# Patient Record
Sex: Male | Born: 1950 | Race: Asian | Marital: Married | Smoking: Never smoker
Health system: Southern US, Community
[De-identification: ages and names within clinical notes are randomized; demographics above are authoritative.]

## PROBLEM LIST (undated history)

## (undated) DIAGNOSIS — E119 Type 2 diabetes mellitus without complications: Secondary | ICD-10-CM

## (undated) DIAGNOSIS — E079 Disorder of thyroid, unspecified: Secondary | ICD-10-CM

## (undated) DIAGNOSIS — I10 Essential (primary) hypertension: Secondary | ICD-10-CM

## (undated) HISTORY — DX: Essential (primary) hypertension: I10

## (undated) HISTORY — DX: Disorder of thyroid, unspecified: E07.9

## (undated) HISTORY — PX: CHOLECYSTECTOMY: SHX55

## (undated) HISTORY — DX: Type 2 diabetes mellitus without complications: E11.9

---

## 2011-09-05 ENCOUNTER — Ambulatory Visit
Admission: RE | Admit: 2011-09-05 | Discharge: 2011-09-05 | Disposition: A | Discharge status: Home / Self Care | Payer: No Typology Code available for payment source | Source: Ambulatory Visit | Attending: Geriatric Medicine | Admitting: Geriatric Medicine

## 2011-09-05 ENCOUNTER — Other Ambulatory Visit: Payer: Self-pay | Admitting: Geriatric Medicine

## 2011-09-05 DIAGNOSIS — R29898 Other symptoms and signs involving the musculoskeletal system: Secondary | ICD-10-CM

## 2012-04-14 ENCOUNTER — Encounter (HOSPITAL_COMMUNITY): Payer: Self-pay | Admitting: Emergency Medicine

## 2012-04-14 ENCOUNTER — Emergency Department (HOSPITAL_COMMUNITY)
Admission: EM | Admit: 2012-04-14 | Discharge: 2012-04-14 | Disposition: A | Discharge status: Home / Self Care | Payer: No Typology Code available for payment source | Source: Home / Self Care | Attending: Emergency Medicine | Admitting: Emergency Medicine

## 2012-04-14 DIAGNOSIS — E039 Hypothyroidism, unspecified: Secondary | ICD-10-CM

## 2012-04-14 DIAGNOSIS — M545 Low back pain, unspecified: Secondary | ICD-10-CM

## 2012-04-14 DIAGNOSIS — I10 Essential (primary) hypertension: Secondary | ICD-10-CM

## 2012-04-14 DIAGNOSIS — E119 Type 2 diabetes mellitus without complications: Secondary | ICD-10-CM

## 2012-04-14 DIAGNOSIS — IMO0002 Reserved for concepts with insufficient information to code with codable children: Secondary | ICD-10-CM

## 2012-04-14 DIAGNOSIS — M1710 Unilateral primary osteoarthritis, unspecified knee: Secondary | ICD-10-CM

## 2012-04-14 DIAGNOSIS — M1712 Unilateral primary osteoarthritis, left knee: Secondary | ICD-10-CM

## 2012-04-14 MED ORDER — DICLOFENAC SODIUM 1 % TD GEL: 2.0000 g | Freq: Four times a day (QID) | TRANSDERMAL | Status: DC

## 2012-04-14 MED ORDER — IRBESARTAN 300 MG PO TABS: 300.0000 mg | ORAL_TABLET | Freq: Every day | ORAL | Status: DC

## 2012-04-14 MED ORDER — METFORMIN HCL 500 MG PO TABS: 500.0000 mg | ORAL_TABLET | Freq: Two times a day (BID) | ORAL | Status: DC

## 2012-04-14 MED ORDER — DICLOFENAC POTASSIUM 50 MG PO TABS: 50.0000 mg | ORAL_TABLET | Freq: Three times a day (TID) | ORAL | Status: DC

## 2012-04-14 MED ORDER — GLIMEPIRIDE 2 MG PO TABS: 2.0000 mg | ORAL_TABLET | Freq: Two times a day (BID) | ORAL | Status: DC

## 2012-04-14 MED ORDER — OMEPRAZOLE 20 MG PO CPDR: 20.0000 mg | DELAYED_RELEASE_CAPSULE | Freq: Every day | ORAL | Status: DC

## 2012-04-14 MED ORDER — LEVOTHYROXINE SODIUM 25 MCG PO TABS: 25.0000 ug | ORAL_TABLET | Freq: Every day | ORAL | Status: DC

## 2012-04-14 MED ORDER — METOPROLOL TARTRATE 50 MG PO TABS: 50.0000 mg | ORAL_TABLET | Freq: Two times a day (BID) | ORAL | Status: DC

## 2012-04-14 NOTE — ED Notes (Signed)
Patient complains of left knee pain for the past 6 months. Pain while walking and crossing legs. Pain is worse on medial side of knee.

## 2012-04-14 NOTE — ED Notes (Signed)
Patient left without signing discharge papers.

## 2012-04-14 NOTE — ED Provider Notes (Signed)
Chief Complaint  Patient presents with  . Knee Pain    History of Present Illness:   Jose Brady is a 62 year old male who presents today with multiple medical problems. He moved here about 6 months ago from [place]. He has seen his physician in [place] right before he left and had blood work done at that time. He had just enough of his medication to get through until the present. He does not have a primary care physician in the [place] area. His medical problems include: left knee pain, low back pain, diabetes, hypertension, hypothyroidism, and gastroesophageal reflux. The knee pain has been going on for about 6 months. He denies any injury. Is localized over the medial joint line and he's had some swelling. His doctor in [place] had prescribed diclofenac gel, diclofenac pills, and is also taking fish oil. He's had back pain for about 2-3 years. It radiates down the right leg at times and he's had an x-ray but doesn't think she's ever had an MRI scan. He denies any numbness or weakness in the legs, bladder or bowel symptoms. He's had diabetes for about 8 years and is on metformin 500 mg twice daily and glimepiride 2 mg twice a day. His last hemoglobin A1c was 5.8%. His blood sugars have been under good control with a range of about 90-130 fasting. He denies any hypoglycemic episodes, polyuria, polydipsia, or changes in his vision. He does have some numbness in his hands and tingling and triggering of his fingers in his hands. He has had high blood pressure for about 12 years and is on irbesartan 300 mg once a day and metoprolol 50 mg twice a day. He denies headaches, dizziness, shortness of breath, or chest pain. He has had hypothyroidism for years and is on Synthroid 25 mcg per day with no hyper hypothyroid symptoms. His reflux is controlled with omeprazole. He denies dysphagia, nausea, vomiting, weight loss, or evidence of GI bleeding.  Review of Systems:  Other than noted above, the  patient denies any of the following symptoms. Systemic:  No fever, chills, sweats, fatigue, myalgias, headache, or anorexia. Eye:  No redness, pain or drainage. ENT:  No earache, nasal congestion, rhinorrhea, sinus pressure, or sore throat. Lungs:  No cough, sputum production, wheezing, shortness of breath.  Cardiovascular:  No chest pain, palpitations, or syncope. GI:  No nausea, vomiting, abdominal pain or diarrhea. GU:  No dysuria, frequency, or hematuria. Skin:  No rash or pruritis.  PMFSH:  Past medical history, family history, social history, meds, and allergies were reviewed.   Physical Exam:   Vital signs:  BP 133/97  Pulse 90  Temp 97.2 F (36.2 C) (Oral)  Resp 18  SpO2 99% General:  Alert, in no distress. Eye:  PERRL, full EOMs.  Lids and conjunctivas were normal. ENT:  TMs and canals were normal, without erythema or inflammation.  Nasal mucosa was clear and uncongested, without drainage.  Mucous membranes were moist.  Pharynx was clear, without exudate or drainage.  There were no oral ulcerations or lesions. Neck:  Supple, no adenopathy, tenderness or mass. Thyroid was normal. Lungs:  No respiratory distress.  Lungs were clear to auscultation, without wheezes, rales or rhonchi.  Breath sounds were clear and equal bilaterally. Heart:  Regular rhythm, without gallops, murmers or rubs. Abdomen:  Soft, flat, and non-tender to palpation.  No hepatosplenomagaly or mass. Extremities: There is pain to palpation over the medial joint line of the left knee. His knee  has a full range of motion with slight pain and no crepitus. Back: His back was nontender to palpation and had a full range of motion with minimal pain. Skin:  Clear, warm, and dry, without rash or lesions.  Labs:  No results found for this or any previous visit.   Radiology:  No results found.  Assessment:  The primary encounter diagnosis was Type 2 diabetes mellitus. Diagnoses of Hypertension, Low back pain,  Osteoarthritis of left knee, and Hypothyroidism were also pertinent to this visit.  Plan:   1.  The following meds were prescribed:   New Prescriptions   DICLOFENAC (CATAFLAM) 50 MG TABLET    Take 1 tablet (50 mg total) by mouth 3 (three) times daily.   DICLOFENAC SODIUM (VOLTAREN) 1 % GEL    Apply 2 g topically 4 (four) times daily.   GLIMEPIRIDE (AMARYL) 2 MG TABLET    Take 1 tablet (2 mg total) by mouth 2 (two) times daily.   IRBESARTAN (AVAPRO) 300 MG TABLET    Take 1 tablet (300 mg total) by mouth at bedtime.   LEVOTHYROXINE (LEVOTHROID) 25 MCG TABLET    Take 1 tablet (25 mcg total) by mouth daily.   METFORMIN (GLUCOPHAGE) 500 MG TABLET    Take 1 tablet (500 mg total) by mouth 2 (two) times daily with a meal.   METOPROLOL (LOPRESSOR) 50 MG TABLET    Take 1 tablet (50 mg total) by mouth 2 (two) times daily.   OMEPRAZOLE (PRILOSEC) 20 MG CAPSULE    Take 1 capsule (20 mg total) by mouth daily.   2.  The patient was instructed in symptomatic care and handouts were given. 3.  The patient was told to return if becoming worse in any way, if no better in 3 or 4 days, and given some red flag symptoms that would indicate earlier return.  Follow up:  The patient was told to follow up with the adult clinic as soon as possible, preferably within the next few weeks to establish as a patient there. He has enough refills to last for 6 months. I elected not to get any blood work today since he had had some 6 months ago and he will be following up with the adult clinic in the near future.     Reuben Likes, MD 04/14/12 1147

## 2012-06-17 ENCOUNTER — Encounter (HOSPITAL_COMMUNITY): Payer: Self-pay

## 2012-06-17 ENCOUNTER — Emergency Department (HOSPITAL_COMMUNITY)
Admission: EM | Admit: 2012-06-17 | Discharge: 2012-06-17 | Disposition: A | Discharge status: Home / Self Care | Payer: No Typology Code available for payment source | Source: Home / Self Care | Attending: Family Medicine | Admitting: Family Medicine

## 2012-06-17 DIAGNOSIS — M1710 Unilateral primary osteoarthritis, unspecified knee: Secondary | ICD-10-CM

## 2012-06-17 DIAGNOSIS — I10 Essential (primary) hypertension: Secondary | ICD-10-CM | POA: Diagnosis present

## 2012-06-17 DIAGNOSIS — M545 Low back pain, unspecified: Secondary | ICD-10-CM

## 2012-06-17 DIAGNOSIS — E785 Hyperlipidemia, unspecified: Secondary | ICD-10-CM | POA: Diagnosis present

## 2012-06-17 DIAGNOSIS — E039 Hypothyroidism, unspecified: Secondary | ICD-10-CM | POA: Diagnosis present

## 2012-06-17 DIAGNOSIS — IMO0002 Reserved for concepts with insufficient information to code with codable children: Secondary | ICD-10-CM

## 2012-06-17 DIAGNOSIS — E559 Vitamin D deficiency, unspecified: Secondary | ICD-10-CM | POA: Diagnosis present

## 2012-06-17 DIAGNOSIS — E119 Type 2 diabetes mellitus without complications: Secondary | ICD-10-CM | POA: Diagnosis present

## 2012-06-17 LAB — COMPREHENSIVE METABOLIC PANEL
Alkaline Phosphatase: 99 U/L (ref 39–117)
BUN: 12 mg/dL (ref 6–23)
GFR calc Af Amer: 88 mL/min — ABNORMAL LOW (ref >90)
Glucose, Bld: 122 mg/dL — ABNORMAL HIGH (ref 70–99)
Potassium: 4.2 mEq/L (ref 3.5–5.1)
Total Protein: 7.6 g/dL (ref 6.0–8.3)

## 2012-06-17 LAB — HEMOGLOBIN A1C: Mean Plasma Glucose: 126 mg/dL — ABNORMAL HIGH (ref <117)

## 2012-06-17 LAB — MICROALBUMIN / CREATININE URINE RATIO
Creatinine, Urine: 39.6 mg/dL
Microalb Creat Ratio: 12.6 mg/g (ref 0.0–30.0)
Microalb, Ur: 0.5 mg/dL (ref 0.00–1.89)

## 2012-06-17 LAB — LIPID PANEL
Cholesterol: 194 mg/dL (ref 0–200)
Total CHOL/HDL Ratio: 5.2 RATIO

## 2012-06-17 LAB — CBC
Hemoglobin: 16.2 g/dL (ref 13.0–17.0)
MCH: 30.5 pg (ref 26.0–34.0)
RBC: 5.32 MIL/uL (ref 4.22–5.81)
WBC: 7.2 10*3/uL (ref 4.0–10.5)

## 2012-06-17 LAB — TSH: TSH: 4.474 u[IU]/mL (ref 0.350–4.500)

## 2012-06-17 MED ORDER — PROTONIX 40 MG PO TBEC: 40.0000 mg | DELAYED_RELEASE_TABLET | Freq: Every day | ORAL | Status: DC

## 2012-06-17 MED ORDER — DIOVAN HCT 320-12.5 MG PO TABS: 1.0000 | ORAL_TABLET | Freq: Every day | ORAL | Status: DC

## 2012-06-17 MED ORDER — TOPROL XL 50 MG PO TB24: 50.0000 mg | ORAL_TABLET | Freq: Every day | ORAL | Status: DC

## 2012-06-17 MED ORDER — SYNTHROID 25 MCG PO TABS: 25.0000 ug | ORAL_TABLET | Freq: Every day | ORAL | Status: DC

## 2012-06-17 MED ORDER — METFORMIN HCL ER 500 MG PO TB24: 500.0000 mg | ORAL_TABLET | Freq: Two times a day (BID) | ORAL | Status: DC

## 2012-06-17 NOTE — ED Provider Notes (Signed)
History    CSN: [id number]  Arrival date & time 06/17/12  1034   First MD Initiated Contact with Patient 06/17/12 1056     Chief Complaint  Patient presents with  . Establish Care   HPI Pt is presenting today as a new patient to the clinic with a complex medical history including HTN, Hyperlipidemia and Type 2 diabetes mellitus.  He had been receiving all medical care in [place] but relocated here with his wife.  He reports that he is feeling well except for occasional headaches.  His blood pressure is elevated today.  No chest pain and no history of heart disease reported.  No SOB symptoms.    History reviewed Hypertension Hyperlipidemia Chronic osteoarthritis  History reviewed. No pertinent past surgical history.  No family history on file.  History  Substance Use Topics  . Smoking status: Not on file  . Smokeless tobacco: Not on file  . Alcohol Use: Not on file    Review of Systems  Respiratory: Negative for chest tightness and shortness of breath.   Cardiovascular: Negative for chest pain, palpitations and leg swelling.  Gastrointestinal: Negative for nausea, vomiting, abdominal pain, diarrhea and constipation.  Musculoskeletal: Positive for arthralgias.  Neurological: Positive for headaches. Negative for dizziness, syncope, weakness and numbness.  All other systems reviewed and are negative.    Allergies  Penicillins  Home Medications   Current Outpatient Rx  Name  Route  Sig  Dispense  Refill  . diclofenac (CATAFLAM) 50 MG tablet   Oral   Take 1 tablet (50 mg total) by mouth 3 (three) times daily.   30 tablet   2   . diclofenac sodium (VOLTAREN) 1 % GEL   Topical   Apply 2 g topically 4 (four) times daily.   100 g   5   . glimepiride (AMARYL) 2 MG tablet   Oral   Take 1 tablet (2 mg total) by mouth 2 (two) times daily.   60 tablet   5   . irbesartan (AVAPRO) 300 MG tablet   Oral   Take 1 tablet (300 mg total) by mouth at bedtime.   30 tablet   5   . levothyroxine (LEVOTHROID) 25 MCG tablet   Oral   Take 1 tablet (25 mcg total) by mouth daily.   30 tablet   5   . metFORMIN (GLUCOPHAGE) 500 MG tablet   Oral   Take 1 tablet (500 mg total) by mouth 2 (two) times daily with a meal.   60 tablet   5   . metoprolol (LOPRESSOR) 50 MG tablet   Oral   Take 1 tablet (50 mg total) by mouth 2 (two) times daily.   60 tablet   5   . omeprazole (PRILOSEC) 20 MG capsule   Oral   Take 1 capsule (20 mg total) by mouth daily.   30 capsule   5     BP 149/94  Pulse 81  Temp(Src) 97.8 F (36.6 C) (Oral)  SpO2 100%  Physical Exam  Nursing note and vitals reviewed. Constitutional: He is oriented to person, place, and time. He appears well-developed and well-nourished. No distress.  HENT:  Head: Normocephalic and atraumatic.  Right Ear: External ear normal.  Left Ear: External ear normal.  Nose: Mucosal edema present.  Mouth/Throat: Oropharyngeal exudate present.  Eyes: Conjunctivae and EOM are normal. Pupils are equal, round, and reactive to light.  Neck: Normal range of motion. Neck supple. No JVD present. No  tracheal deviation present. No thyromegaly present.  Cardiovascular: Normal rate, regular rhythm and normal heart sounds.   Pulmonary/Chest: Effort normal and breath sounds normal.  Abdominal: Soft. Bowel sounds are normal. He exhibits no distension and no mass. There is no tenderness. There is no rebound and no guarding.  Musculoskeletal: Normal range of motion. He exhibits no edema and no tenderness.  Lymphadenopathy:    He has no cervical adenopathy.  Neurological: He is alert and oriented to person, place, and time. He has normal reflexes. No cranial nerve deficit. He exhibits normal muscle tone. Coordination normal.  Skin: Skin is warm and dry. No rash noted. No erythema. No pallor.  Psychiatric: He has a normal mood and affect. His behavior is normal. Judgment and thought content normal.    ED Course  Procedures  (including critical care time)  Labs Reviewed - No data to display No results found.  No diagnosis found.  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  The patient verbalized understanding.  The patient was told to call to get lab results if they haven't heard anything in the next week.    Results for orders placed during the hospital encounter of 06/17/12  COMPREHENSIVE METABOLIC PANEL      Result Value Range   Sodium 136  135 - 145 mEq/L   Potassium 4.2  3.5 - 5.1 mEq/L   Chloride 101  96 - 112 mEq/L   CO2 25  19 - 32 mEq/L   Glucose, Bld 122 (*) 70 - 99 mg/dL   BUN 12  6 - 23 mg/dL   Creatinine, Ser 1.02  0.50 - 1.35 mg/dL   Calcium 9.7  8.4 - 72.5 mg/dL   Total Protein 7.6  6.0 - 8.3 g/dL   Albumin 4.1  3.5 - 5.2 g/dL   AST 19  0 - 37 U/L   ALT 21  0 - 53 U/L   Alkaline Phosphatase 99  39 - 117 U/L   Total Bilirubin 0.4  0.3 - 1.2 mg/dL   GFR calc non Af Amer 76 (*) >90 mL/min   GFR calc Af Amer 88 (*) >90 mL/min  LIPID PANEL      Result Value Range   Cholesterol 194  0 - 200 mg/dL   Triglycerides 366 (*) <150 mg/dL   HDL 37 (*) >44 mg/dL   Total CHOL/HDL Ratio 5.2     VLDL 75 (*) 0 - 40 mg/dL   LDL Cholesterol 82  0 - 99 mg/dL  HEMOGLOBIN I3K      Result Value Range   Hemoglobin A1C 6.0 (*) <5.7 %   Mean Plasma  Glucose 126 (*) <117 mg/dL  TSH      Result Value Range   TSH 4.474  0.350 - 4.500 uIU/mL  CBC  Result Value Range   WBC 7.2  4.0 - 10.5 K/uL   RBC 5.32  4.22 - 5.81 MIL/uL   Hemoglobin 16.2  13.0 - 17.0 g/dL   HCT 40.9  81.1 - 91.4 %   MCV 83.6  78.0 - 100.0 fL   MCH 30.5  26.0 - 34.0 pg   MCHC 36.4 (*) 30.0 - 36.0 g/dL   RDW 78.2  95.6 - 21.3 %   Platelets 235  150 - 400 K/uL  MICROALBUMIN / CREATININE URINE RATIO      Result Value Range   Microalb, Ur 0.50  0.00 - 1.89 mg/dL   Creatinine, Urine 08.6     Microalb Creat Ratio 12.6  0.0 - 30.0 mg/g            Cleora Fleet, MD 06/17/12 2045

## 2012-06-17 NOTE — ED Notes (Signed)
Patient her to establish primary care doctor

## 2012-06-18 ENCOUNTER — Telehealth (HOSPITAL_COMMUNITY): Payer: Self-pay

## 2012-06-18 LAB — VITAMIN D 25 HYDROXY (VIT D DEFICIENCY, FRACTURES): Vit D, 25-Hydroxy: 21 ng/mL — ABNORMAL LOW (ref 30–89)

## 2012-06-18 NOTE — Progress Notes (Signed)
Quick Note:  Please inform patient that she has prediabetes. Please mail patient information on diet and diabetes. Pt has Vit D insufficiency. Please have patient start taking vitamin D over the counter 1000 IU caps - 1 po daily. Recheck labs in 3 months.    Rodney Langton, MD, CDE, FAAFP [facility] [place], [place]   ______

## 2012-06-19 MED ORDER — MICARDIS HCT 80-12.5 MG PO TABS: 1.0000 | ORAL_TABLET | Freq: Every day | ORAL | Status: DC

## 2012-06-20 ENCOUNTER — Telehealth (HOSPITAL_COMMUNITY): Payer: Self-pay

## 2012-06-20 NOTE — ED Notes (Signed)
Lab results given  Needs to take vitamin D- OTC 1000IU caps 1 po daily

## 2012-08-12 ENCOUNTER — Telehealth: Payer: Self-pay | Admitting: General Practice

## 2012-08-12 NOTE — Telephone Encounter (Signed)
Medical Assistance Program calling to ask if script for Micardis sent on 08/08/12 can be replaced with Diavan Hct 160/12.5 Month samples.  Fax #[phone].

## 2012-08-13 MED ORDER — VALSARTAN-HYDROCHLOROTHIAZIDE 160-12.5 MG PO TABS: 1.0000 | ORAL_TABLET | Freq: Every day | ORAL | Status: DC

## 2012-08-13 NOTE — Telephone Encounter (Signed)
Health department requesting a change in medications

## 2012-08-13 NOTE — Addendum Note (Signed)
Addended by: Alison Murray on: 08/13/2012 01:41 PM   Modules accepted: Orders, Medications

## 2012-08-26 ENCOUNTER — Telehealth: Payer: Self-pay | Admitting: Family Medicine

## 2012-08-26 NOTE — Telephone Encounter (Signed)
In March, pt was told to f/u in 1 month. i suggest he schedule an appt as he is way overdue for that f/u and the medication issue can be addressed at that time. Thanks AW.

## 2012-08-26 NOTE — Telephone Encounter (Signed)
Pt would like new script for a higher dosage for metoprolol so that instead of twice a day he can take once a day.  He would [facility] to [facility].

## 2012-08-26 NOTE — Telephone Encounter (Signed)
Patient to comeback get BP rechecked and meds adjusted

## 2012-08-27 ENCOUNTER — Telehealth: Payer: Self-pay | Admitting: *Deleted

## 2012-08-27 NOTE — Telephone Encounter (Signed)
08/27/12 Patient was unavailable spoke with patient son Angelica Pou made aware that patient  Need to make  appointment  For blood presssure check and medication adjustment. Appoinment scheduled  for 09/02/12 [facility]

## 2012-09-02 ENCOUNTER — Encounter: Payer: Self-pay | Admitting: Family Medicine

## 2012-09-02 ENCOUNTER — Ambulatory Visit: Payer: No Typology Code available for payment source | Attending: Family Medicine | Admitting: Family Medicine

## 2012-09-02 VITALS — BP 130/84 | HR 84 | Temp 97.9°F | Wt 155.0 lb

## 2012-09-02 DIAGNOSIS — E785 Hyperlipidemia, unspecified: Secondary | ICD-10-CM

## 2012-09-02 DIAGNOSIS — E039 Hypothyroidism, unspecified: Secondary | ICD-10-CM

## 2012-09-02 DIAGNOSIS — E559 Vitamin D deficiency, unspecified: Secondary | ICD-10-CM

## 2012-09-02 DIAGNOSIS — I10 Essential (primary) hypertension: Secondary | ICD-10-CM

## 2012-09-02 DIAGNOSIS — E119 Type 2 diabetes mellitus without complications: Secondary | ICD-10-CM

## 2012-09-02 MED ORDER — VALSARTAN-HYDROCHLOROTHIAZIDE 160-12.5 MG PO TABS: 1.0000 | ORAL_TABLET | Freq: Every day | ORAL | Status: DC

## 2012-09-02 MED ORDER — TOPROL XL 50 MG PO TB24: 50.0000 mg | ORAL_TABLET | Freq: Every day | ORAL | Status: DC

## 2012-09-02 MED ORDER — SYNTHROID 25 MCG PO TABS: 25.0000 ug | ORAL_TABLET | Freq: Every day | ORAL | Status: DC

## 2012-09-02 NOTE — Progress Notes (Signed)
Patient ID: Jose Brady, male   DOB: [birth year], 62 y.o.   MRN: [id number]  CC: follow up   HPI: Pt says that he needs some substitutions for his medicaitons for the MAP program to get the medication from the pharm companies.  He says he is doing well.  He just needs to have his prescriptions switched so that the cost is not so much for the family.    Allergies  Allergen Reactions  . Penicillins Hives   Past Medical History  Diagnosis Date  . Hypertension    Current Outpatient Prescriptions on File Prior to Visit  Medication Sig Dispense Refill  . diclofenac (CATAFLAM) 50 MG tablet Take 1 tablet (50 mg total) by mouth 3 (three) times daily.  30 tablet  2  . glimepiride (AMARYL) 2 MG tablet Take 1 tablet (2 mg total) by mouth 2 (two) times daily.  60 tablet  5  . metFORMIN (GLUCOPHAGE XR) 500 MG 24 hr tablet Take 1 tablet (500 mg total) by mouth 2 (two) times daily with a meal.  60 tablet  3  . PROTONIX 40 MG tablet Take 1 tablet (40 mg total) by mouth daily.  30 tablet  3  . [DISCONTINUED] irbesartan (AVAPRO) 300 MG tablet Take 1 tablet (300 mg total) by mouth at bedtime.  30 tablet  5  . [DISCONTINUED] metoprolol (LOPRESSOR) 50 MG tablet Take 1 tablet (50 mg total) by mouth 2 (two) times daily.  60 tablet  5  . [DISCONTINUED] omeprazole (PRILOSEC) 20 MG capsule Take 1 capsule (20 mg total) by mouth daily.  30 capsule  5   No current facility-administered medications on file prior to visit.   Family History  Problem Relation Age of Onset  . Hypertension Mother   . Hypertension Father    History   Social History  . Marital Status: Married    Spouse Name: N/A    Number of Children: N/A  . Years of Education: N/A   Occupational History  . Not on file.   Social History Main Topics  . Smoking status: Never Smoker   . Smokeless tobacco: Not on file  . Alcohol Use: Not on file  . Drug Use: Not on file  . Sexually Active: Not on file   Other Topics Concern  . Not on  file   Social History Narrative  . No narrative on file    Review of Systems  Constitutional: Negative for fever, chills, diaphoresis, activity change, appetite change and fatigue.  HENT: Negative for ear pain, nosebleeds, congestion, facial swelling, rhinorrhea, neck pain, neck stiffness and ear discharge.   Eyes: Negative for pain, discharge, redness, itching and visual disturbance.  Respiratory: Negative for cough, choking, chest tightness, shortness of breath, wheezing and stridor.   Cardiovascular: Negative for chest pain, palpitations and leg swelling.  Gastrointestinal: Negative for abdominal distention.  Genitourinary: Negative for dysuria, urgency, frequency, hematuria, flank pain, decreased urine volume, difficulty urinating and dyspareunia.  Musculoskeletal: Negative for back pain, joint swelling, arthralgias and gait problem.  Neurological: Negative for dizziness, tremors, seizures, syncope, facial asymmetry, speech difficulty, weakness, light-headedness, numbness and headaches.  Hematological: Negative for adenopathy. Does not bruise/bleed easily.  Psychiatric/Behavioral: Negative for hallucinations, behavioral problems, confusion, dysphoric mood, decreased concentration and agitation.    Objective:   Filed Vitals:   09/02/12 1252  BP: 130/84  Pulse: 84  Temp: 97.9 F (36.6 C)    Physical Exam  Constitutional: Appears well-developed and well-nourished. No distress.  HENT: Normocephalic.  External right and left ear normal. Oropharynx is clear and moist.  Eyes: Conjunctivae and EOM are normal. PERRLA, no scleral icterus.  Neck: Normal ROM. Neck supple. No JVD. No tracheal deviation. No thyromegaly.  CVS: RRR, S1/S2 +, no murmurs, no gallops, no carotid bruit.  Pulmonary: Effort and breath sounds normal, no stridor, rhonchi, wheezes, rales.  Abdominal: Soft. BS +,  no distension, tenderness, rebound or guarding.  Musculoskeletal: Normal range of motion. No edema and no  tenderness.  Lymphadenopathy: No lymphadenopathy noted, cervical, inguinal. Neuro: Alert. Normal reflexes, muscle tone coordination. No cranial nerve deficit. Skin: Skin is warm and dry. No rash noted. Not diaphoretic. No erythema. No pallor.  Psychiatric: Normal mood and affect. Behavior, judgment, thought content normal.   Lab Results  Component Value Date   WBC 7.2 06/17/2012   HGB 16.2 06/17/2012   HCT 44.5 06/17/2012   MCV 83.6 06/17/2012   PLT 235 06/17/2012   Lab Results  Component Value Date   CREATININE 1.02 06/17/2012   BUN 12 06/17/2012   NA 136 06/17/2012   K 4.2 06/17/2012   CL 101 06/17/2012   CO2 25 06/17/2012    Lab Results  Component Value Date   HGBA1C 6.0* 06/17/2012   Lipid Panel     Component Value Date/Time   CHOL 194 06/17/2012 1130   TRIG 376* 06/17/2012 1130   HDL 37* 06/17/2012 1130   CHOLHDL 5.2 06/17/2012 1130   VLDL 75* 06/17/2012 1130   LDLCALC 82 06/17/2012 1130       Assessment and plan:   Patient Active Problem List   Diagnosis Date Noted  . Hypertension 06/17/2012  . Type 2 diabetes mellitus 06/17/2012  . Dyslipidemia 06/17/2012  . Hypothyroidism 06/17/2012  . Vitamin D insufficiency 06/17/2012   Re printed the prescriptions for trhe paitent as it had already been done but I gave them to him to hand deliver to the [facility] program.  He said he would take them over there immediately for diovanhct, synthroid and toprol XL.    Follow up in 3 months  The patient was given clear instructions to go to ER or return to medical center if symptoms don't improve, worsen or new problems develop.  The patient verbalized understanding.  The patient was told to call to get lab results if they haven't heard anything in the next week.    Rodney Langton, MD, CDE, FAAFP [facility] [place], [place]

## 2012-09-02 NOTE — Patient Instructions (Signed)
DASH Diet The DASH diet stands for "Dietary Approaches to Stop Hypertension." It is a healthy eating plan that has been shown to reduce high blood pressure (hypertension) in as little as 14 days, while also possibly providing other significant health benefits. These other health benefits include reducing the risk of breast cancer after menopause and reducing the risk of type 2 diabetes, heart disease, colon cancer, and stroke. Health benefits also include weight loss and slowing kidney failure in patients with chronic kidney disease.  DIET GUIDELINES  Limit salt (sodium). Your diet should contain less than 1500 mg of sodium daily.  Limit refined or processed carbohydrates. Your diet should include mostly whole grains. Desserts and added sugars should be used sparingly.  Include small amounts of heart-healthy fats. These types of fats include nuts, oils, and tub margarine. Limit saturated and trans fats. These fats have been shown to be harmful in the body. CHOOSING FOODS  The following food groups are based on a 2000 calorie diet. See your Registered Dietitian for individual calorie needs. Grains and Grain Products (6 to 8 servings daily)  Eat More Often: Whole-wheat bread, brown rice, whole-grain or wheat pasta, quinoa, popcorn without added fat or salt (air popped).  Eat Less Often: White bread, white pasta, white rice, cornbread. Vegetables (4 to 5 servings daily)  Eat More Often: Fresh, frozen, and canned vegetables. Vegetables may be raw, steamed, roasted, or grilled with a minimal amount of fat.  Eat Less Often/Avoid: Creamed or fried vegetables. Vegetables in a cheese sauce. Fruit (4 to 5 servings daily)  Eat More Often: All fresh, canned (in natural juice), or frozen fruits. Dried fruits without added sugar. One hundred percent fruit juice ( cup [237 mL] daily).  Eat Less Often: Dried fruits with added sugar. Canned fruit in light or heavy syrup. [employer], Fish, and Poultry (2  servings or less daily. One serving is 3 to 4 oz [85-114 g]).  Eat More Often: Ninety percent or leaner ground beef, tenderloin, sirloin. Round cuts of beef, chicken breast, [place] breast. All fish. Grill, bake, or broil your meat. Nothing should be fried.  Eat Less Often/Avoid: Fatty cuts of meat, [place], or chicken leg, thigh, or wing. Fried cuts of meat or fish. Dairy (2 to 3 servings)  Eat More Often: Low-fat or fat-free milk, low-fat plain or light yogurt, reduced-fat or part-skim cheese.  Eat Less Often/Avoid: Milk (whole, 2%).Whole milk yogurt. Full-fat cheeses. Nuts, Seeds, and Legumes (4 to 5 servings per week)  Eat More Often: All without added salt.  Eat Less Often/Avoid: Salted nuts and seeds, canned beans with added salt. Fats and Sweets (limited)  Eat More Often: Vegetable oils, tub margarines without trans fats, sugar-free gelatin. Mayonnaise and salad dressings.  Eat Less Often/Avoid: Coconut oils, palm oils, butter, stick margarine, cream, half and half, cookies, candy, pie. FOR MORE INFORMATION The Dash Diet Eating Plan: www.dashdiet.org Document Released: 03/01/2011 Document Revised: 06/04/2011 Document Reviewed: 03/01/2011 [facility] Patient Information 2014 [place], [place]. Blood Sugar Monitoring, Adult GLUCOSE METERS FOR SELF-MONITORING OF BLOOD GLUCOSE  It is important to be able to correctly measure your blood sugar (glucose). You can use a blood glucose monitor (a small battery-operated device) to check your glucose level at any time. This allows you and your caregiver to monitor your diabetes and to determine how well your treatment plan is working. The process of monitoring your blood glucose with a glucose meter is called self-monitoring of blood glucose (SMBG). When people with diabetes control their  blood sugar, they have better health. To test for glucose with a typical glucose meter, place the disposable strip in the meter. Then place a small sample of blood  on the "test strip." The test strip is coated with chemicals that combine with glucose in blood. The meter measures how much glucose is present. The meter displays the glucose level as a number. Several new models can record and store a number of test results. Some models can connect to personal computers to store test results or print them out.  Newer meters are often easier to use than older models. Some meters allow you to get blood from places other than your fingertip. Some new models have automatic timing, error codes, signals, or barcode readers to help with proper adjustment (calibration). Some meters have a large display screen or spoken instructions for people with visual impairments.  INSTRUCTIONS FOR USING GLUCOSE METERS  Wash your hands with soap and warm water, or clean the area with alcohol. Dry your hands completely.  Prick the side of your fingertip with a lancet (a sharp-pointed tool used by hand).  Hold the hand down and gently milk the finger until a small drop of blood appears. Catch the blood with the test strip.  Follow the instructions for inserting the test strip and using the SMBG meter. Most meters require the meter to be turned on and the test strip to be inserted before applying the blood sample.  Record the test result.  Read the instructions carefully for both the meter and the test strips that go with it. Meter instructions are found in the user manual. Keep this manual to help you solve any problems that may arise. Many meters use "error codes" when there is a problem with the meter, the test strip, or the blood sample on the strip. You will need the manual to understand these error codes and fix the problem.  New devices are available such as laser lancets and meters that can test blood taken from "alternative sites" of the body, other than fingertips. However, you should use standard fingertip testing if your glucose changes rapidly. Also, use standard testing  if:  You have eaten, exercised, or taken insulin in the past 2 hours.  You think your glucose is low.  You tend to not feel symptoms of low blood glucose (hypoglycemia).  You are ill or under stress.  Clean the meter as directed by the manufacturer.  Test the meter for accuracy as directed by the manufacturer.  Take your meter with you to your caregiver's office. This way, you can test your glucose in front of your caregiver to make sure you are using the meter correctly. Your caregiver can also take a sample of blood to test using a routine lab method. If values on the glucose meter are close to the lab results, you and your caregiver will see that your meter is working well and you are using good technique. Your caregiver will advise you about what to do if the results do not match. FREQUENCY OF TESTING  Your caregiver will tell you how often you should check your blood glucose. This will depend on your type of diabetes, your current level of diabetes control, and your types of medicines. The following are general guidelines, but your care plan may be different. Record all your readings and the time of day you took them for review with your caregiver.   Diabetes type 1.  When you are using insulin with good diabetic  control (either multiple daily injections or via a pump), you should check your glucose 4 times a day.  If your diabetes is not well controlled, you may need to monitor more frequently, including before meals and 2 hours after meals, at bedtime, and occasionally between 2 a.m. and 3 a.m.  You should always check your glucose before a dose of insulin or before changing the rate on your insulin pump.  Diabetes type 2.  Guidelines for SMBG in diabetes type 2 are not as well defined.  If you are on insulin, follow the guidelines above.  If you are on medicines, but not insulin, and your glucose is not well controlled, you should test at least twice daily.  If you are not  on insulin, and your diabetes is controlled with medicines or diet alone, you should test at least once daily, usually before breakfast.  A weekly profile will help your caregiver advise you on your care plan. The week before your visit, check your glucose before a meal and 2 hours after a meal at least daily. You may want to test before and after a different meal each day so you and your caregiver can tell how well controlled your blood sugars are throughout the course of a 24 hour period.  Gestational diabetes (diabetes during pregnancy).  Frequent testing is often necessary. Accurate timing is important.  If you are not on insulin, check your glucose 4 times a day. Check it before breakfast and 1 hour after the start of each meal.  If you are on insulin, check your glucose 6 times a day. Check it before each meal and 1 hour after the first bite of each meal.  General guidelines.  More frequent testing is required at the start of insulin treatment. Your caregiver will instruct you.  Test your glucose any time you suspect you have low blood sugar (hypoglycemia).  You should test more often when you change medicines, when you have unusual stress or illness, or in other unusual circumstances. OTHER THINGS TO KNOW ABOUT GLUCOSE METERS  Measurement Range. Most glucose meters are able to read glucose levels over a broad range of values from as low as 0 to as high as 600 mg/dL. If you get an extremely high or low reading from your meter, you should first confirm it with another reading. Report very high or very low readings to your caregiver.  Whole Blood Glucose versus Plasma Glucose. Some older home glucose meters measure glucose in your whole blood. In a lab or when using some newer home glucose meters, the glucose is measured in your plasma (one component of blood). The difference can be important. It is important for you and your caregiver to know whether your meter gives its results as "whole  blood equivalent" or "plasma equivalent."  Display of High and Low Glucose Values. Part of learning how to operate a meter is understanding what the meter results mean. Know how high and low glucose concentrations are displayed on your meter.  Factors that Affect Glucose Meter Performance. The accuracy of your test results depends on many factors and varies depending on the brand and type of meter. These factors include:  Low red blood cell count (anemia).  Substances in your blood (such as uric acid, vitamin C, and others).  Environmental factors (temperature, humidity, altitude).  Name-brand versus generic test strips.  Calibration. Make sure your meter is set up properly. It is a good idea to do a calibration test with a  control solution recommended by the manufacturer of your meter whenever you begin using a fresh bottle of test strips. This will help verify the accuracy of your meter.  Improperly stored, expired, or defective test strips. Keep your strips in a dry place with the lid on.  Soiled meter.  Inadequate blood sample. NEW TECHNOLOGIES FOR GLUCOSE TESTING Alternative site testing Some glucose meters allow testing blood from alternative sites. These include the:  Upper arm.  Forearm.  Base of the thumb.  Thigh. Sampling blood from alternative sites may be desirable. However, it may have some limitations. Blood in the fingertips show changes in glucose levels more quickly than blood in other parts of the body. This means that alternative site test results may be different from fingertip test results, not because of the meter's ability to test accurately, but because the actual glucose concentration can be different.  Continuous Glucose Monitoring Devices to measure your blood glucose continuously are available, and others are in development. These methods can be more expensive than self-monitoring with a glucose meter. However, it is uncertain how effective and reliable  these devices are. Your caregiver will advise you if this approach makes sense for you. IF BLOOD SUGARS ARE CONTROLLED, PEOPLE WITH DIABETES REMAIN HEALTHIER.  SMBG is an important part of the treatment plan of patients with diabetes mellitus. Below are reasons for using SMBG:   It confirms that your glucose is at a specific, healthy level.  It detects hypoglycemia and severe hyperglycemia.  It allows you and your caregiver to make adjustments in response to changes in lifestyle for individuals requiring medicine.  It determines the need for starting insulin therapy in temporary diabetes that happens during pregnancy (gestational diabetes). Document Released: 03/15/2003 Document Revised: 06/04/2011 Document Reviewed: 07/06/2010 [facility] Patient Information 2014 [place], [place]. Hypertension As your heart beats, it forces blood through your arteries. This force is your blood pressure. If the pressure is too high, it is called hypertension (HTN) or high blood pressure. HTN is dangerous because you may have it and not know it. High blood pressure may mean that your heart has to work harder to pump blood. Your arteries may be narrow or stiff. The extra work puts you at risk for heart disease, stroke, and other problems.  Blood pressure consists of two numbers, a higher number over a lower, 110/72, for example. It is stated as "110 over 72." The ideal is below 120 for the top number (systolic) and under 80 for the bottom (diastolic). Write down your blood pressure today. You should pay close attention to your blood pressure if you have certain conditions such as:  Heart failure.  Prior heart attack.  Diabetes  Chronic kidney disease.  Prior stroke.  Multiple risk factors for heart disease. To see if you have HTN, your blood pressure should be measured while you are seated with your arm held at the level of the heart. It should be measured at least twice. A one-time elevated blood pressure  reading (especially in the Emergency Department) does not mean that you need treatment. There may be conditions in which the blood pressure is different between your right and left arms. It is important to see your caregiver soon for a recheck. Most people have essential hypertension which means that there is not a specific cause. This type of high blood pressure may be lowered by changing lifestyle factors such as:  Stress.  Smoking.  Lack of exercise.  Excessive weight.  Drug/tobacco/alcohol use.  Eating less  salt. Most people do not have symptoms from high blood pressure until it has caused damage to the body. Effective treatment can often prevent, delay or reduce that damage. TREATMENT  When a cause has been identified, treatment for high blood pressure is directed at the cause. There are a large number of medications to treat HTN. These fall into several categories, and your caregiver will help you select the medicines that are best for you. Medications may have side effects. You should review side effects with your caregiver. If your blood pressure stays high after you have made lifestyle changes or started on medicines,   Your medication(s) may need to be changed.  Other problems may need to be addressed.  Be certain you understand your prescriptions, and know how and when to take your medicine.  Be sure to follow up with your caregiver within the time frame advised (usually within two weeks) to have your blood pressure rechecked and to review your medications.  If you are taking more than one medicine to lower your blood pressure, make sure you know how and at what times they should be taken. Taking two medicines at the same time can result in blood pressure that is too low. SEEK IMMEDIATE MEDICAL CARE IF:  You develop a severe headache, blurred or changing vision, or confusion.  You have unusual weakness or numbness, or a faint feeling.  You have severe chest or abdominal  pain, vomiting, or breathing problems. MAKE SURE YOU:   Understand these instructions.  Will watch your condition.  Will get help right away if you are not doing well or get worse. Document Released: 03/12/2005 Document Revised: 06/04/2011 Document Reviewed: 10/31/2007 [facility] Patient Information 2014 [place], [place].

## 2012-09-03 ENCOUNTER — Telehealth: Payer: Self-pay | Admitting: Family Medicine

## 2012-09-03 DIAGNOSIS — I10 Essential (primary) hypertension: Secondary | ICD-10-CM

## 2012-09-03 MED ORDER — OLMESARTAN MEDOXOMIL-HCTZ 20-12.5 MG PO TABS: 1.0000 | ORAL_TABLET | Freq: Every day | ORAL | Status: DC

## 2012-09-03 NOTE — Telephone Encounter (Signed)
09/03/12 Patient made aware of need to pick up prescription. Son will come and pick up [facility]

## 2012-09-03 NOTE — Telephone Encounter (Signed)
Note from health dept that diovan hct no longer avail but benicar hct is avail for free. Have printed script. Pt can bring to HD. Pt should let us know if any side effects with new medicine. Schedule BP check in 2 weeks please.

## 2012-10-15 ENCOUNTER — Other Ambulatory Visit: Payer: Self-pay | Admitting: Family Medicine

## 2012-10-15 DIAGNOSIS — I10 Essential (primary) hypertension: Secondary | ICD-10-CM

## 2012-10-15 MED ORDER — OLMESARTAN MEDOXOMIL-HCTZ 40-12.5 MG PO TABS: 1.0000 | ORAL_TABLET | Freq: Every day | ORAL | Status: DC

## 2012-10-15 NOTE — Telephone Encounter (Signed)
[facility] health Department contacted [place] multiple times and that they are not able to provide Diovan or myocarditis and requesting that we switch the medication to Benicar HCT so patient can receive for free.  New prescriptions being faxed over today for Benicar HCT.   Rodney Langton, MD, CDE, FAAFP [facility] [place], [place]

## 2012-11-26 ENCOUNTER — Ambulatory Visit: Payer: No Typology Code available for payment source | Attending: Internal Medicine | Admitting: Internal Medicine

## 2012-11-26 VITALS — BP 142/93 | HR 89 | Temp 97.7°F | Resp 17

## 2012-11-26 DIAGNOSIS — M1712 Unilateral primary osteoarthritis, left knee: Secondary | ICD-10-CM

## 2012-11-26 DIAGNOSIS — E039 Hypothyroidism, unspecified: Secondary | ICD-10-CM

## 2012-11-26 DIAGNOSIS — Z23 Encounter for immunization: Secondary | ICD-10-CM

## 2012-11-26 DIAGNOSIS — M1710 Unilateral primary osteoarthritis, unspecified knee: Secondary | ICD-10-CM

## 2012-11-26 DIAGNOSIS — E119 Type 2 diabetes mellitus without complications: Secondary | ICD-10-CM

## 2012-11-26 DIAGNOSIS — I10 Essential (primary) hypertension: Secondary | ICD-10-CM

## 2012-11-26 DIAGNOSIS — IMO0002 Reserved for concepts with insufficient information to code with codable children: Secondary | ICD-10-CM

## 2012-11-26 MED ORDER — METFORMIN HCL ER 500 MG PO TB24: 500.0000 mg | ORAL_TABLET | Freq: Two times a day (BID) | ORAL | Status: DC

## 2012-11-26 MED ORDER — TOPROL XL 50 MG PO TB24: 50.0000 mg | ORAL_TABLET | Freq: Every day | ORAL | Status: DC

## 2012-11-26 MED ORDER — OLMESARTAN MEDOXOMIL-HCTZ 40-12.5 MG PO TABS: 1.0000 | ORAL_TABLET | Freq: Every day | ORAL | Status: DC

## 2012-11-26 MED ORDER — SYNTHROID 25 MCG PO TABS: 25.0000 ug | ORAL_TABLET | Freq: Every day | ORAL | Status: DC

## 2012-11-26 MED ORDER — PROTONIX 40 MG PO TBEC: 40.0000 mg | DELAYED_RELEASE_TABLET | Freq: Every day | ORAL | Status: DC

## 2012-11-26 MED ORDER — DICLOFENAC POTASSIUM 50 MG PO TABS: 50.0000 mg | ORAL_TABLET | Freq: Three times a day (TID) | ORAL | Status: DC

## 2012-11-26 MED ORDER — GLIMEPIRIDE 2 MG PO TABS: 2.0000 mg | ORAL_TABLET | Freq: Two times a day (BID) | ORAL | Status: DC

## 2012-11-26 NOTE — Progress Notes (Signed)
Patient here for follow up _HTN 

## 2012-11-26 NOTE — Progress Notes (Signed)
Patient Demographics  Jose Brady, is a 62 y.o. male  ZOX:[id number]  [id number]:[id number]  DOB - 07/03/1950  Chief Complaint  Patient presents with  . Follow-up        Subjective:   Jose Brady today is here for a follow up visit.His only complaint is left knee pain. No fever or swelling or overlying erythema  Patient has No headache, No chest pain, No abdominal pain - No Nausea, No new weakness tingling or numbness, No Cough - SOB.   Objective:    Filed Vitals:   11/26/12 1744  BP: 142/93  Pulse: 89  Temp: 97.7 F (36.5 C)  Resp: 17  SpO2: 100%     ALLERGIES:   Allergies  Allergen Reactions  . Penicillins Hives    PAST MEDICAL HISTORY: Past Medical History  Diagnosis Date  . Hypertension     MEDICATIONS AT HOME: Prior to Admission medications   Medication Sig Start Date End Date Taking? Authorizing Provider  diclofenac (CATAFLAM) 50 MG tablet Take 1 tablet (50 mg total) by mouth 3 (three) times daily. 11/26/12   Airyonna Franklyn Levora Dredge, MD  glimepiride (AMARYL) 2 MG tablet Take 1 tablet (2 mg total) by mouth 2 (two) times daily. 11/26/12   Glenis Musolf Levora Dredge, MD  metFORMIN (GLUCOPHAGE XR) 500 MG 24 hr tablet Take 1 tablet (500 mg total) by mouth 2 (two) times daily with a meal. 11/26/12   Dayveon Halley Levora Dredge, MD  olmesartan-hydrochlorothiazide (BENICAR HCT) 40-12.5 MG per tablet Take 1 tablet by mouth daily. 11/26/12   Jodene Polyak Levora Dredge, MD  PROTONIX 40 MG tablet Take 1 tablet (40 mg total) by mouth daily. 11/26/12   Kreig Parson Levora Dredge, MD  SYNTHROID 25 MCG tablet Take 1 tablet (25 mcg total) by mouth daily. 11/26/12   Ansleigh Safer Levora Dredge, MD  TOPROL XL 50 MG 24 hr tablet Take 1 tablet (50 mg total) by mouth daily. Take with or immediately following a meal. 11/26/12   Maretta Bees, MD     Exam  General appearance :Awake, alert, not in any distress. Speech Clear. Not toxic Looking HEENT: Atraumatic and Normocephalic, pupils equally reactive to light and  accomodation Neck: supple, no JVD. No cervical lymphadenopathy.  Chest:Good air entry bilaterally, no added sounds  CVS: S1 S2 regular, no murmurs.  Abdomen: Bowel sounds present, Non tender and not distended with no gaurding, rigidity or rebound. Extremities: B/L Lower Ext shows no edema, both legs are warm to touch Neurology: Awake alert, and oriented X 3, CN II-XII intact, Non focal Skin:No Rash Wounds:N/A    Data Review   CBC No results found for this basename: WBC, HGB, HCT, PLT, MCV, MCH, MCHC, RDW, NEUTRABS, LYMPHSABS, MONOABS, EOSABS, BASOSABS, BANDABS, BANDSABD,  in the last 168 hours  Chemistries   No results found for this basename: NA, K, CL, CO2, GLUCOSE, BUN, CREATININE, GFRCGP, CALCIUM, MG, AST, ALT, ALKPHOS, BILITOT,  in the last 168 hours ------------------------------------------------------------------------------------------------------------------ No results found for this basename: HGBA1C,  in the last 72 hours ------------------------------------------------------------------------------------------------------------------ No results found for this basename: CHOL, HDL, LDLCALC, TRIG, CHOLHDL, LDLDIRECT,  in the last 72 hours ------------------------------------------------------------------------------------------------------------------ No results found for this basename: TSH, T4TOTAL, FREET3, T3FREE, THYROIDAB,  in the last 72 hours ------------------------------------------------------------------------------------------------------------------ No results found for this basename: VITAMINB12, FOLATE, FERRITIN, TIBC, IRON, RETICCTPCT,  in the last 72 hours  Coagulation profile  No results found for this basename: INR, PROTIME,  in the last 168 hours    Assessment & Plan  HTN -stable -c/w Toprol and Benicar/HCTZ  DM -c/w Metformin and Glimeperide  Hypothyroidism -c/w Levothyroxine  Left Knee Pain -check Xray -refer to sports medicine  Health  Maintenance -Colonoscopy:will refer -Vaccinations:   -Influenza  Follow up in 1 month   The patient was given clear instructions to go to ER or return to medical center if symptoms don't improve, worsen or new problems develop. The patient verbalized understanding. The patient was told to call to get lab results if they haven't heard anything in the next week.

## 2012-11-28 ENCOUNTER — Ambulatory Visit (HOSPITAL_COMMUNITY)
Admission: RE | Admit: 2012-11-28 | Discharge: 2012-11-28 | Disposition: A | Discharge status: Home / Self Care | Payer: No Typology Code available for payment source | Source: Ambulatory Visit | Attending: Internal Medicine | Admitting: Internal Medicine

## 2012-11-28 DIAGNOSIS — M25569 Pain in unspecified knee: Secondary | ICD-10-CM | POA: Insufficient documentation

## 2012-11-28 DIAGNOSIS — M1712 Unilateral primary osteoarthritis, left knee: Secondary | ICD-10-CM

## 2012-12-02 ENCOUNTER — Other Ambulatory Visit: Payer: Self-pay | Admitting: Emergency Medicine

## 2012-12-03 ENCOUNTER — Ambulatory Visit: Payer: Self-pay

## 2012-12-03 ENCOUNTER — Ambulatory Visit (HOSPITAL_COMMUNITY): Payer: No Typology Code available for payment source

## 2012-12-05 ENCOUNTER — Telehealth: Payer: Self-pay

## 2012-12-05 NOTE — Telephone Encounter (Signed)
X ray results given to Jose Brady(SON) He will let his dad know

## 2012-12-05 NOTE — Telephone Encounter (Signed)
Message copied by Jose Brady on Fri Dec 05, 2012  3:57 PM ------      Message from: Jeanann Lewandowsky E      Created: Wed Dec 03, 2012  1:05 PM       Please call patient to inform him about the knee x-ray. It shows slight degenerative disease but no acute findings seen. ------

## 2012-12-11 ENCOUNTER — Ambulatory Visit (INDEPENDENT_AMBULATORY_CARE_PROVIDER_SITE_OTHER): Payer: No Typology Code available for payment source | Admitting: Sports Medicine

## 2012-12-11 ENCOUNTER — Telehealth: Payer: Self-pay | Admitting: Sports Medicine

## 2012-12-11 VITALS — BP 136/91 | Ht 63.0 in | Wt 158.0 lb

## 2012-12-11 DIAGNOSIS — IMO0002 Reserved for concepts with insufficient information to code with codable children: Secondary | ICD-10-CM

## 2012-12-11 DIAGNOSIS — M25562 Pain in left knee: Secondary | ICD-10-CM

## 2012-12-11 DIAGNOSIS — M25569 Pain in unspecified knee: Secondary | ICD-10-CM

## 2012-12-11 DIAGNOSIS — M7050 Other bursitis of knee, unspecified knee: Secondary | ICD-10-CM

## 2012-12-11 MED ORDER — METHYLPREDNISOLONE ACETATE 40 MG/ML IJ SUSP
40.0000 mg | Freq: Once | INTRAMUSCULAR | Status: AC
  Administered 2012-12-11: 40 mg via INTRA_ARTICULAR

## 2012-12-12 NOTE — Telephone Encounter (Signed)
Opened by accident

## 2012-12-12 NOTE — Progress Notes (Signed)
  Subjective:    Patient ID: Jose Brady, male    DOB: [birth year], 62 y.o.   MRN: [id number]  HPI chief complaint: Left knee pain  Very pleasant 62 year old male comes in today complaining of 2 months of left knee pain. No specific injury that he can recall but gradual onset of pain it is localized to the medial knee. Pain is worse with weightbearing improves at rest. He has noticed some mild swelling along the medial knee. No mechanical symptoms. No prior surgeries to this knee. He did recently undergo x-rays of his left knee on September 3 which showed some mild medial compartmental DJD but nothing acute. No pain more proximally in the groin. He has been wearing a soft compression sleeve which has been minimally helpful. No associated numbness or tingling.  Past medical history is reviewed. Medications are reviewed He is allergic to penicillin Denies alcohol use, no tobacco    Review of Systems     Objective:   Physical Exam Well-developed, well-nourished. No acute distress. Awake alert and oriented x3. Vital signs are reviewed  Left knee: Full range of motion. No effusion. There is tenderness to palpation along the pes anserine bursa with mild swelling in this area. No tenderness to palpation along medial or lateral joint lines. Negative McMurray's. Knee is stable to ligamentous exam. Neurovascularly intact distally. Walking with a slight limp.  X-rays are as above       Assessment & Plan:  Left knee pain likely secondary to pes anserine bursitis  Patient's x-rays show only mild degenerative changes and clinically I think his symptoms are arising from the pes anserine bursa. I've elected to injected this area today with cortisone. Consent was obtained. Area was draped and prepped in a sterile fashion and injected with 1 cc of 40 mg Depo-Medrol and 1 cc of 1% Xylocaine. Patient tolerated this without difficulty. I've given him a better compression sleeve to wear with  activity and he is given patient information regarding his diagnosis as well as a home exercise program. He will followup with me in 4 weeks. If pain persists I may consider the merits of a diagnostic/therapeutic intra-articular cortisone injection into the left knee. Call with questions or concerns in the interim.

## 2012-12-25 ENCOUNTER — Ambulatory Visit: Payer: No Typology Code available for payment source | Attending: Family Medicine | Admitting: Family Medicine

## 2012-12-25 ENCOUNTER — Encounter: Payer: Self-pay | Admitting: Family Medicine

## 2012-12-25 VITALS — BP 135/89 | HR 98 | Temp 98.6°F | Resp 16 | Wt 157.0 lb

## 2012-12-25 DIAGNOSIS — M1710 Unilateral primary osteoarthritis, unspecified knee: Secondary | ICD-10-CM | POA: Insufficient documentation

## 2012-12-25 DIAGNOSIS — E119 Type 2 diabetes mellitus without complications: Secondary | ICD-10-CM

## 2012-12-25 DIAGNOSIS — E559 Vitamin D deficiency, unspecified: Secondary | ICD-10-CM

## 2012-12-25 DIAGNOSIS — I10 Essential (primary) hypertension: Secondary | ICD-10-CM

## 2012-12-25 DIAGNOSIS — E039 Hypothyroidism, unspecified: Secondary | ICD-10-CM

## 2012-12-25 DIAGNOSIS — E785 Hyperlipidemia, unspecified: Secondary | ICD-10-CM

## 2012-12-25 MED ORDER — OMEGA-3-ACID ETHYL ESTERS 1 G PO CAPS: 2.0000 g | ORAL_CAPSULE | Freq: Two times a day (BID) | ORAL | Status: DC

## 2012-12-25 NOTE — Patient Instructions (Signed)
Hypertension As your heart beats, it forces blood through your arteries. This force is your blood pressure. If the pressure is too high, it is called hypertension (HTN) or high blood pressure. HTN is dangerous because you may have it and not know it. High blood pressure may mean that your heart has to work harder to pump blood. Your arteries may be narrow or stiff. The extra work puts you at risk for heart disease, stroke, and other problems.  Blood pressure consists of two numbers, a higher number over a lower, 110/72, for example. It is stated as "110 over 72." The ideal is below 120 for the top number (systolic) and under 80 for the bottom (diastolic). Write down your blood pressure today. You should pay close attention to your blood pressure if you have certain conditions such as:  Heart failure.  Prior heart attack.  Diabetes  Chronic kidney disease.  Prior stroke.  Multiple risk factors for heart disease. To see if you have HTN, your blood pressure should be measured while you are seated with your arm held at the level of the heart. It should be measured at least twice. A one-time elevated blood pressure reading (especially in the Emergency Department) does not mean that you need treatment. There may be conditions in which the blood pressure is different between your right and left arms. It is important to see your caregiver soon for a recheck. Most people have essential hypertension which means that there is not a specific cause. This type of high blood pressure may be lowered by changing lifestyle factors such as:  Stress.  Smoking.  Lack of exercise.  Excessive weight.  Drug/tobacco/alcohol use.  Eating less salt. Most people do not have symptoms from high blood pressure until it has caused damage to the body. Effective treatment can often prevent, delay or reduce that damage. TREATMENT  When a cause has been identified, treatment for high blood pressure is directed at the  cause. There are a large number of medications to treat HTN. These fall into several categories, and your caregiver will help you select the medicines that are best for you. Medications may have side effects. You should review side effects with your caregiver. If your blood pressure stays high after you have made lifestyle changes or started on medicines,   Your medication(s) may need to be changed.  Other problems may need to be addressed.  Be certain you understand your prescriptions, and know how and when to take your medicine.  Be sure to follow up with your caregiver within the time frame advised (usually within two weeks) to have your blood pressure rechecked and to review your medications.  If you are taking more than one medicine to lower your blood pressure, make sure you know how and at what times they should be taken. Taking two medicines at the same time can result in blood pressure that is too low. SEEK IMMEDIATE MEDICAL CARE IF:  You develop a severe headache, blurred or changing vision, or confusion.  You have unusual weakness or numbness, or a faint feeling.  You have severe chest or abdominal pain, vomiting, or breathing problems. MAKE SURE YOU:   Understand these instructions.  Will watch your condition.  Will get help right away if you are not doing well or get worse. Document Released: 03/12/2005 Document Revised: 06/04/2011 Document Reviewed: 10/31/2007 Exit[employer] Patient Information 2014 [employer]. Hypertriglyceridemia  Diet for High blood levels of Triglycerides Most fats in food are triglycerides. Triglycerides   in your blood are stored as fat in your body. High levels of triglycerides in your blood may put you at a greater risk for heart disease and stroke.  Normal triglyceride levels are less than 150 mg/dL. Borderline high levels are 150-199 mg/dl. High levels are 200 - 499 mg/dL, and very high triglyceride levels are greater than 500 mg/dL. The decision  to treat high triglycerides is generally based on the level. For people with borderline or high triglyceride levels, treatment includes weight loss and exercise. Drugs are recommended for people with very high triglyceride levels. Many people who need treatment for high triglyceride levels have metabolic syndrome. This syndrome is a collection of disorders that often include: insulin resistance, high blood pressure, blood clotting problems, high cholesterol and triglycerides. TESTING PROCEDURE FOR TRIGLYCERIDES  You should not eat 4 hours before getting your triglycerides measured. The normal range of triglycerides is between 10 and 250 milligrams per deciliter (mg/dl). Some people may have extreme levels (1000 or above), but your triglyceride level may be too high if it is above 150 mg/dl, depending on what other risk factors you have for heart disease.  People with high blood triglycerides may also have high blood cholesterol levels. If you have high blood cholesterol as well as high blood triglycerides, your risk for heart disease is probably greater than if you only had high triglycerides. High blood cholesterol is one of the main risk factors for heart disease. CHANGING YOUR DIET  Your weight can affect your blood triglyceride level. If you are more than 20% above your ideal body weight, you may be able to lower your blood triglycerides by losing weight. Eating less and exercising regularly is the best way to combat this. Fat provides more calories than any other food. The best way to lose weight is to eat less fat. Only 30% of your total calories should come from fat. Less than 7% of your diet should come from saturated fat. A diet low in fat and saturated fat is the same as a diet to decrease blood cholesterol. By eating a diet lower in fat, you may lose weight, lower your blood cholesterol, and lower your blood triglyceride level.  Eating a diet low in fat, especially saturated fat, may also help  you lower your blood triglyceride level. Ask your dietitian to help you figure how much fat you can eat based on the number of calories your caregiver has prescribed for you.  Exercise, in addition to helping with weight loss may also help lower triglyceride levels.   Alcohol can increase blood triglycerides. You may need to stop drinking alcoholic beverages.  Too much carbohydrate in your diet may also increase your blood triglycerides. Some complex carbohydrates are necessary in your diet. These may include bread, rice, potatoes, other starchy vegetables and cereals.  Reduce "simple" carbohydrates. These may include pure sugars, candy, honey, and jelly without losing other nutrients. If you have the kind of high blood triglycerides that is affected by the amount of carbohydrates in your diet, you will need to eat less sugar and less high-sugar foods. Your caregiver can help you with this.  Adding 2-4 grams of fish oil (EPA+ DHA) may also help lower triglycerides. Speak with your caregiver before adding any supplements to your regimen. Following the Diet  Maintain your ideal weight. Your caregivers can help you with a diet. Generally, eating less food and getting more exercise will help you lose weight. Joining a weight control group may also help. Ask   your caregivers for a good weight control group in your area.  Eat low-fat foods instead of high-fat foods. This can help you lose weight too.  These foods are lower in fat. Eat MORE of these:   Dried beans, peas, and lentils.  Egg whites.  Low-fat cottage cheese.  Fish.  Lean cuts of meat, such as round, sirloin, rump, and flank (cut extra fat off meat you fix).  Whole grain breads, cereals and pasta.  Skim and nonfat dry milk.  Low-fat yogurt.  Poultry without the skin.  Cheese made with skim or part-skim milk, such as mozzarella, parmesan, farmers', ricotta, or pot cheese. These are higher fat foods. Eat LESS of these:   Whole  milk and foods made from whole milk, such as American, blue, cheddar, monterey jack, and swiss cheese  High-fat meats, such as luncheon meats, sausages, knockwurst, bratwurst, hot dogs, ribs, corned beef, ground pork, and regular ground beef.  Fried foods. Limit saturated fats in your diet. Substituting unsaturated fat for saturated fat may decrease your blood triglyceride level. You will need to read package labels to know which products contain saturated fats.  These foods are high in saturated fat. Eat LESS of these:   Fried pork skins.  Whole milk.  Skin and fat from poultry.  Palm oil.  Butter.  Shortening.  Cream cheese.  Bacon.  Margarines and baked goods made from listed oils.  Vegetable shortenings.  Chitterlings.  Fat from meats.  Coconut oil.  Palm kernel oil.  Lard.  Cream.  Sour cream.  Fatback.  Coffee whiteners and non-dairy creamers made with these oils.  Cheese made from whole milk. Use unsaturated fats (both polyunsaturated and monounsaturated) moderately. Remember, even though unsaturated fats are better than saturated fats; you still want a diet low in total fat.  These foods are high in unsaturated fat:   Canola oil.  Sunflower oil.  Mayonnaise.  Almonds.  Peanuts.  Pine nuts.  Margarines made with these oils.  Safflower oil.  Olive oil.  Avocados.  Cashews.  Peanut butter.  Sunflower seeds.  Soybean oil.  Peanut oil.  Olives.  Pecans.  Walnuts.  Pumpkin seeds. Avoid sugar and other high-sugar foods. This will decrease carbohydrates without decreasing other nutrients. Sugar in your food goes rapidly to your blood. When there is excess sugar in your blood, your liver may use it to make more triglycerides. Sugar also contains calories without other important nutrients.  Eat LESS of these:   Sugar, brown sugar, powdered sugar, jam, jelly, preserves, honey, syrup, molasses, pies, candy, cakes, cookies,  frosting, pastries, colas, soft drinks, punches, fruit drinks, and regular gelatin.  Avoid alcohol. Alcohol, even more than sugar, may increase blood triglycerides. In addition, alcohol is high in calories and low in nutrients. Ask for sparkling water, or a diet soft drink instead of an alcoholic beverage. Suggestions for planning and preparing meals   Bake, broil, grill or roast meats instead of frying.  Remove fat from meats and skin from poultry before cooking.  Add spices, herbs, lemon juice or vinegar to vegetables instead of salt, rich sauces or gravies.  Use a non-stick skillet without fat or use no-stick sprays.  Cool and refrigerate stews and broth. Then remove the hardened fat floating on the surface before serving.  Refrigerate meat drippings and skim off fat to make low-fat gravies.  Serve more fish.  Use less butter, margarine and other high-fat spreads on bread or vegetables.  Use skim or reconstituted non-fat   dry milk for cooking.  Cook with low-fat cheeses.  Substitute low-fat yogurt or cottage cheese for all or part of the sour cream in recipes for sauces, dips or congealed salads.  Use half yogurt/half mayonnaise in salad recipes.  Substitute evaporated skim milk for cream. Evaporated skim milk or reconstituted non-fat dry milk can be whipped and substituted for whipped cream in certain recipes.  Choose fresh fruits for dessert instead of high-fat foods such as pies or cakes. Fruits are naturally low in fat. When Dining Out   Order low-fat appetizers such as fruit or vegetable juice, pasta with vegetables or tomato sauce.  Select clear, rather than cream soups.  Ask that dressings and gravies be served on the side. Then use less of them.  Order foods that are baked, broiled, poached, steamed, stir-fried, or roasted.  Ask for margarine instead of butter, and use only a small amount.  Drink sparkling water, unsweetened tea or coffee, or diet soft drinks  instead of alcohol or other sweet beverages. QUESTIONS AND ANSWERS ABOUT OTHER FATS IN THE BLOOD: SATURATED FAT, TRANS FAT, AND CHOLESTEROL What is trans fat? Trans fat is a type of fat that is formed when vegetable oil is hardened through a process called hydrogenation. This process helps makes foods more solid, gives them shape, and prolongs their shelf life. Trans fats are also called hydrogenated or partially hydrogenated oils.  What do saturated fat, trans fat, and cholesterol in foods have to do with heart disease? Saturated fat, trans fat, and cholesterol in the diet all raise the level of LDL "bad" cholesterol in the blood. The higher the LDL cholesterol, the greater the risk for coronary heart disease (CHD). Saturated fat and trans fat raise LDL similarly.  What foods contain saturated fat, trans fat, and cholesterol? High amounts of saturated fat are found in animal products, such as fatty cuts of meat, chicken skin, and full-fat dairy products like butter, whole milk, cream, and cheese, and in tropical vegetable oils such as palm, palm kernel, and coconut oil. Trans fat is found in some of the same foods as saturated fat, such as vegetable shortening, some margarines (especially hard or stick margarine), crackers, cookies, baked goods, fried foods, salad dressings, and other processed foods made with partially hydrogenated vegetable oils. Small amounts of trans fat also occur naturally in some animal products, such as milk products, beef, and lamb. Foods high in cholesterol include liver, other organ meats, egg yolks, shrimp, and full-fat dairy products. How can I use the new food label to make heart-healthy food choices? Check the Nutrition Facts panel of the food label. Choose foods lower in saturated fat, trans fat, and cholesterol. For saturated fat and cholesterol, you can also use the Percent Daily Value (%DV): 5% DV or less is low, and 20% DV or more is high. (There is no %DV for trans  fat.) Use the Nutrition Facts panel to choose foods low in saturated fat and cholesterol, and if the trans fat is not listed, read the ingredients and limit products that list shortening or hydrogenated or partially hydrogenated vegetable oil, which tend to be high in trans fat. POINTS TO REMEMBER:   Discuss your risk for heart disease with your caregivers, and take steps to reduce risk factors.  Change your diet. Choose foods that are low in saturated fat, trans fat, and cholesterol.  Add exercise to your daily routine if it is not already being done. Participate in physical activity of moderate intensity, like brisk   walking, for at least 30 minutes on most, and preferably all days of the week. No time? Break the 30 minutes into three, 10-minute segments during the day.  Stop smoking. If you do smoke, contact your caregiver to discuss ways in which they can help you quit.  Do not use street drugs.  Maintain a normal weight.  Maintain a healthy blood pressure.  Keep up with your blood work for checking the fats in your blood as directed by your caregiver. Document Released: 12/29/2003 Document Revised: 09/11/2011 Document Reviewed: 07/26/2008 [employer] Patient Information 2014 [employer].  

## 2012-12-25 NOTE — Progress Notes (Signed)
Patient ID: Jose Brady, male   DOB: 1951/02/25, 62 y.o.   MRN: [id number]  CC:  Follow up   HPI: Pt has been doing very well.  He had labs done and ECHO done at a [place] [facility] in [place] and had EF of 60%.  He was found to have an A1c of 5.9%.  He was told he had an elevated TSH.   Pt is planning to return to [place] in 2 months for 2.5 months.  He has no complaints today.    Allergies  Allergen Reactions  . Penicillins Hives   Past Medical History  Diagnosis Date  . Hypertension    Current Outpatient Prescriptions on File Prior to Visit  Medication Sig Dispense Refill  . diclofenac (CATAFLAM) 50 MG tablet Take 1 tablet (50 mg total) by mouth 3 (three) times daily.  90 tablet  3  . glimepiride (AMARYL) 2 MG tablet Take 1 tablet (2 mg total) by mouth 2 (two) times daily.  90 tablet  3  . metFORMIN (GLUCOPHAGE XR) 500 MG 24 hr tablet Take 1 tablet (500 mg total) by mouth 2 (two) times daily with a meal.  90 tablet  3  . olmesartan-hydrochlorothiazide (BENICAR HCT) 40-12.5 MG per tablet Take 1 tablet by mouth daily.  90 tablet  3  . PROTONIX 40 MG tablet Take 1 tablet (40 mg total) by mouth daily.  90 tablet  3  . SYNTHROID 25 MCG tablet Take 1 tablet (25 mcg total) by mouth daily.  30 tablet  3  . TOPROL XL 50 MG 24 hr tablet Take 1 tablet (50 mg total) by mouth daily. Take with or immediately following a meal.  90 tablet  3  . [DISCONTINUED] irbesartan (AVAPRO) 300 MG tablet Take 1 tablet (300 mg total) by mouth at bedtime.  30 tablet  5  . [DISCONTINUED] metoprolol (LOPRESSOR) 50 MG tablet Take 1 tablet (50 mg total) by mouth 2 (two) times daily.  60 tablet  5  . [DISCONTINUED] omeprazole (PRILOSEC) 20 MG capsule Take 1 capsule (20 mg total) by mouth daily.  30 capsule  5   No current facility-administered medications on file prior to visit.   Family History  Problem Relation Age of Onset  . Hypertension Mother   . Hypertension Father    History   Social History   . Marital Status: Married    Spouse Name: N/A    Number of Children: N/A  . Years of Education: N/A   Occupational History  . Not on file.   Social History Main Topics  . Smoking status: Never Smoker   . Smokeless tobacco: Not on file  . Alcohol Use: Not on file  . Drug Use: Not on file  . Sexual Activity: Not on file   Other Topics Concern  . Not on file   Social History Narrative  . No narrative on file    Review of Systems  Constitutional: Negative for fever, chills, diaphoresis, activity change, appetite change and fatigue.  HENT: Negative for ear pain, nosebleeds, congestion, facial swelling, rhinorrhea, neck pain, neck stiffness and ear discharge.   Eyes: Negative for pain, discharge, redness, itching and visual disturbance.  Respiratory: Negative for cough, choking, chest tightness, shortness of breath, wheezing and stridor.   Cardiovascular: Negative for chest pain, palpitations and leg swelling.  Gastrointestinal: Negative for abdominal distention.  Genitourinary: Negative for dysuria, urgency, frequency, hematuria, flank pain, decreased urine volume, difficulty urinating and dyspareunia.  Musculoskeletal: Negative for  back pain, joint swelling, arthralgias and gait problem.  Neurological: Negative for dizziness, tremors, seizures, syncope, facial asymmetry, speech difficulty, weakness, light-headedness, numbness and headaches.  Hematological: Negative for adenopathy. Does not bruise/bleed easily.  Psychiatric/Behavioral: Negative for hallucinations, behavioral problems, confusion, dysphoric mood, decreased concentration and agitation.    Objective:   Filed Vitals:   12/25/12 1421  BP: 135/89  Pulse: 98  Temp: 98.6 F (37 C)  Resp: 16    Physical Exam  Constitutional: Appears well-developed and well-nourished. No distress.  HENT: Normocephalic. External right and left ear normal. Oropharynx is clear and moist.  Eyes: Conjunctivae and EOM are normal.  PERRLA, no scleral icterus.  Neck: Normal ROM. Neck supple. No JVD. No tracheal deviation. No thyromegaly.  CVS: RRR, S1/S2 +, no murmurs, no gallops, no carotid bruit.  Pulmonary: Effort and breath sounds normal, no stridor, rhonchi, wheezes, rales.  Abdominal: Soft. BS +,  no distension, tenderness, rebound or guarding.  Musculoskeletal: Normal range of motion. No edema and no tenderness.  Lymphadenopathy: No lymphadenopathy noted, cervical, inguinal. Neuro: Alert. Normal reflexes, muscle tone coordination. No cranial nerve deficit. Skin: Skin is warm and dry. No rash noted. Not diaphoretic. No erythema. No pallor.  Psychiatric: Normal mood and affect. Behavior, judgment, thought content normal.   Lab Results  Component Value Date   WBC 7.2 06/17/2012   HGB 16.2 06/17/2012   HCT 44.5 06/17/2012   MCV 83.6 06/17/2012   PLT 235 06/17/2012   Lab Results  Component Value Date   CREATININE 1.02 06/17/2012   BUN 12 06/17/2012   NA 136 06/17/2012   K 4.2 06/17/2012   CL 101 06/17/2012   CO2 25 06/17/2012    Lab Results  Component Value Date   HGBA1C 6.0* 06/17/2012   Lipid Panel     Component Value Date/Time   CHOL 194 06/17/2012 1130   TRIG 376* 06/17/2012 1130   HDL 37* 06/17/2012 1130   CHOLHDL 5.2 06/17/2012 1130   VLDL 75* 06/17/2012 1130   LDLCALC 82 06/17/2012 1130      Assessment and plan:   Patient Active Problem List   Diagnosis Date Noted  . Hypertension 06/17/2012  . Type 2 diabetes mellitus 06/17/2012  . Dyslipidemia 06/17/2012  . Hypothyroidism 06/17/2012  . Vitamin D insufficiency 06/17/2012   Dyslipidemia/hypertriglyceridemia Pt can't tolerate fish oil tabs and he is vegetarian.  Will Rx Lovaza to see if he can tolerate this and get his triglycerides down. The patient's LDL is 67 which is optimally controlled. Recheck lipids on return office visit.   Mildly elevated TSH The patient has increased his levothyroxine to 1.5 tabs every other day, alternating with 1  tab Recheck TSH on return visit  Osteoarthritis left knee Patient was advised to continue followup with sports medicine.  He has received a cortisone injection in the left knee and has had significant improvement.  He is currently wearing an elastic knee brace which provides support and reduce his pain.  Patient reports that he received his flu vaccine 2 weeks ago.  Copy and scan lab reports into the EMR.   RTC in 4 months  The patient was given clear instructions to go to ER or return to medical center if symptoms don't improve, worsen or new problems develop.  The patient verbalized understanding.  The patient was told to call to get any lab results if not heard anything in the next week.    Rodney Langton, MD, CDE, FAAFP [facility] [place]  [place], [place]

## 2012-12-25 NOTE — Progress Notes (Signed)
Pt is here for f/u HTN,DIABETES Last A1C- 5.6

## 2012-12-31 ENCOUNTER — Ambulatory Visit: Payer: Self-pay

## 2013-01-08 ENCOUNTER — Ambulatory Visit (HOSPITAL_COMMUNITY)
Admission: RE | Admit: 2013-01-08 | Discharge: 2013-01-08 | Disposition: A | Discharge status: Home / Self Care | Payer: No Typology Code available for payment source | Source: Ambulatory Visit | Attending: Sports Medicine | Admitting: Sports Medicine

## 2013-01-08 ENCOUNTER — Encounter: Payer: Self-pay | Admitting: Sports Medicine

## 2013-01-08 ENCOUNTER — Ambulatory Visit (INDEPENDENT_AMBULATORY_CARE_PROVIDER_SITE_OTHER): Payer: No Typology Code available for payment source | Admitting: Sports Medicine

## 2013-01-08 VITALS — BP 133/88 | Ht 63.0 in | Wt 158.0 lb

## 2013-01-08 DIAGNOSIS — M51379 Other intervertebral disc degeneration, lumbosacral region without mention of lumbar back pain or lower extremity pain: Secondary | ICD-10-CM | POA: Insufficient documentation

## 2013-01-08 DIAGNOSIS — M79609 Pain in unspecified limb: Secondary | ICD-10-CM | POA: Insufficient documentation

## 2013-01-08 DIAGNOSIS — M5137 Other intervertebral disc degeneration, lumbosacral region: Secondary | ICD-10-CM | POA: Insufficient documentation

## 2013-01-08 DIAGNOSIS — Q762 Congenital spondylolisthesis: Secondary | ICD-10-CM | POA: Insufficient documentation

## 2013-01-08 DIAGNOSIS — M545 Low back pain, unspecified: Secondary | ICD-10-CM

## 2013-01-08 DIAGNOSIS — M5136 Other intervertebral disc degeneration, lumbar region: Secondary | ICD-10-CM

## 2013-01-08 MED ORDER — METHYLPREDNISOLONE ACETATE 80 MG/ML IJ SUSP
80.0000 mg | Freq: Once | INTRAMUSCULAR | Status: AC
  Administered 2013-01-08: 80 mg via INTRAMUSCULAR

## 2013-01-08 MED ORDER — TRAMADOL HCL 50 MG PO TABS: 50.0000 mg | ORAL_TABLET | Freq: Two times a day (BID) | ORAL | Status: DC | PRN

## 2013-01-08 NOTE — Patient Instructions (Signed)
Please go to the radiology department at [facility] to have x-rays done of your lower back.  Please follow up with Dr. Margaretha Sheffield in 3 weeks

## 2013-01-09 ENCOUNTER — Encounter: Payer: Self-pay | Admitting: Internal Medicine

## 2013-01-09 NOTE — Progress Notes (Signed)
  Subjective:    Patient ID: Jose Brady, male    DOB: 26-Nov-1950, 62 y.o.   MRN: [id number]  HPI Patient comes in today for followup on left knee pain. Pain is much better after a cortisone injection into the pes anserine bursa. He's getting some occasional catching and clicking just underneath the patella but it is tolerable. He is also wearing his compression sleeve which she finds comfortable. His main complaint today is low back pain which is been present for 2-3 years. No trauma. No recent x-rays. He takes diclofenac as needed and finds it to be helpful. He gets occasional radiating discomfort down the right leg but it is intermittent. No weakness. No groin pain. No prior low back surgeries. No numbness and tingling. Pain tends to be worse at night. No history of cancer.    Review of Systems     Objective:   Physical Exam Well-developed, well-nourished. No acute distress  Patient has full lumbar mobility but pain with forward flexion and extension. No tenderness to palpation along the lumbar midline or paraspinal musculature. No spasm. Negative straight leg raise bilaterally. Negative log roll bilaterally. Strength is 5/5 in both lower extremities with reflexes trace but equal at the Achilles and patellar tendons. Patient is intact to light touch grossly. No noticeable atrophy.  Left knee: Full range of motion. No effusion. No significant tenderness to palpation at the presence or and bursa. No joint line tenderness. Neurovascularly intact distally. Walking without significant limp.  X-rays of his lumbar spine including AP and lateral views are reviewed. He has bilateral pars defects at L5-S1 with a grade 1 spondylolisthesis. There is associated marked in degenerative disc disease at this level as well. Nothing acute. No bony lesions.       Assessment & Plan:  Chronic low back pain secondary to L5-S1 spondylolisthesis with associated degenerative disc disease Improved  left knee pain status post pes anserine bursa injection  80 mg Depo-Medrol IM injection for his low back pain. Continue on diclofenac as needed. Prescription for Ultram 50 mg to take twice a day as needed for more severe pain. Followup in 3 weeks. Future treatment could consist of physical therapy or possibly lumbar ESI's if radiculopathy becomes more prominent.

## 2013-01-26 ENCOUNTER — Ambulatory Visit (INDEPENDENT_AMBULATORY_CARE_PROVIDER_SITE_OTHER): Payer: No Typology Code available for payment source | Admitting: Sports Medicine

## 2013-01-26 ENCOUNTER — Encounter: Payer: Self-pay | Admitting: Sports Medicine

## 2013-01-26 VITALS — BP 138/89 | HR 90 | Ht 63.0 in | Wt 158.0 lb

## 2013-01-26 DIAGNOSIS — M5137 Other intervertebral disc degeneration, lumbosacral region: Secondary | ICD-10-CM

## 2013-01-26 DIAGNOSIS — M5136 Other intervertebral disc degeneration, lumbar region: Secondary | ICD-10-CM

## 2013-01-26 MED ORDER — DICLOFENAC SODIUM 75 MG PO TBEC: DELAYED_RELEASE_TABLET | ORAL | Status: DC

## 2013-01-26 MED ORDER — DICLOFENAC SODIUM 75 MG PO TBEC: 75.0000 mg | DELAYED_RELEASE_TABLET | Freq: Three times a day (TID) | ORAL | Status: DC

## 2013-01-27 NOTE — Progress Notes (Signed)
  Subjective:    Patient ID: Jose Brady, male    DOB: [birth year], 62 y.o.   MRN: [id number]  HPI Patient comes in today for followup. Left knee is still doing very well. X-rays of his lumbar spine show a grade 1 L5 on S1 spondylolisthesis with associated degenerative disc disease. He states that the diclofenac is helpful but that the Ultram makes him feel "uneasy". He is experiencing intermittent pain in the right leg. No numbness or tingling. He feels like the IM injection of Depo-Medrol at his last visit was helpful.    Review of Systems     Objective:   Physical Exam Well-developed, no acute distress  Patient has full painless lumbar range of motion. No tenderness to palpation along the lumbar midline. No appreciable spasm. Negative straight leg raise bilaterally. Good strength bilaterally. Reflexes are equal at the Achilles and patellar tendons bilaterally. Sensation is intact to light touch grossly. Patient walks without antalgic gait.       Assessment & Plan:  Resolved left knee pain secondary to peds anserine bursitis Chronic low back pain secondary to L5-S1 spondylolisthesis with associated degenerative disc disease  At this point the patient's low back pain is tolerable. I will refill his diclofenac to take as needed with food. He can discontinue the tramadol. If symptoms worsen I would consider merits of physical therapy or possibly lumbar ESI's if radiculopathy becomes more prominent. Followup when necessary.

## 2013-07-08 ENCOUNTER — Ambulatory Visit: Payer: No Typology Code available for payment source

## 2013-07-10 ENCOUNTER — Encounter: Payer: Self-pay | Admitting: Internal Medicine

## 2013-07-10 ENCOUNTER — Ambulatory Visit: Payer: No Typology Code available for payment source | Attending: Internal Medicine | Admitting: Internal Medicine

## 2013-07-10 VITALS — BP 118/86 | HR 79 | Temp 98.0°F | Resp 16 | Ht 63.0 in | Wt 167.0 lb

## 2013-07-10 DIAGNOSIS — I10 Essential (primary) hypertension: Secondary | ICD-10-CM | POA: Insufficient documentation

## 2013-07-10 DIAGNOSIS — E785 Hyperlipidemia, unspecified: Secondary | ICD-10-CM | POA: Insufficient documentation

## 2013-07-10 DIAGNOSIS — E119 Type 2 diabetes mellitus without complications: Secondary | ICD-10-CM

## 2013-07-10 DIAGNOSIS — Z09 Encounter for follow-up examination after completed treatment for conditions other than malignant neoplasm: Secondary | ICD-10-CM | POA: Insufficient documentation

## 2013-07-10 DIAGNOSIS — M25559 Pain in unspecified hip: Secondary | ICD-10-CM | POA: Insufficient documentation

## 2013-07-10 DIAGNOSIS — E039 Hypothyroidism, unspecified: Secondary | ICD-10-CM | POA: Insufficient documentation

## 2013-07-10 LAB — POCT URINALYSIS DIPSTICK
BILIRUBIN UA: NEGATIVE
GLUCOSE UA: 100
Ketones, UA: NEGATIVE
Leukocytes, UA: NEGATIVE
Nitrite, UA: NEGATIVE
Protein, UA: NEGATIVE
RBC UA: NEGATIVE
Spec Grav, UA: 1.025
UROBILINOGEN UA: 0.2
pH, UA: 5.5

## 2013-07-10 LAB — GLUCOSE, POCT (MANUAL RESULT ENTRY): POC Glucose: 125 mg/dl — AB (ref 70–99)

## 2013-07-10 LAB — POCT GLYCOSYLATED HEMOGLOBIN (HGB A1C): Hemoglobin A1C: 6.5

## 2013-07-10 MED ORDER — TRAMADOL HCL 50 MG PO TABS: 50.0000 mg | ORAL_TABLET | Freq: Two times a day (BID) | ORAL | Status: DC | PRN

## 2013-07-10 MED ORDER — DICLOFENAC SODIUM 75 MG PO TBEC: DELAYED_RELEASE_TABLET | ORAL | Status: DC

## 2013-07-10 MED ORDER — SYNTHROID 25 MCG PO TABS
25.0000 ug | ORAL_TABLET | Freq: Every day | ORAL | Status: DC
Start: 2013-07-10 — End: 2014-01-14

## 2013-07-10 MED ORDER — METFORMIN HCL ER 500 MG PO TB24
500.0000 mg | ORAL_TABLET | Freq: Two times a day (BID) | ORAL | Status: DC
Start: 2013-07-10 — End: 2014-01-14

## 2013-07-10 MED ORDER — TOPROL XL 50 MG PO TB24: 50.0000 mg | ORAL_TABLET | Freq: Every day | ORAL | Status: DC

## 2013-07-10 MED ORDER — METOPROLOL TARTRATE 50 MG PO TABS: 50.0000 mg | ORAL_TABLET | Freq: Every morning | ORAL | Status: DC

## 2013-07-10 MED ORDER — PROTONIX 40 MG PO TBEC: 40.0000 mg | DELAYED_RELEASE_TABLET | Freq: Every day | ORAL | Status: DC

## 2013-07-10 MED ORDER — GLIMEPIRIDE 2 MG PO TABS: 2.0000 mg | ORAL_TABLET | Freq: Two times a day (BID) | ORAL | Status: DC

## 2013-07-10 MED ORDER — DOCUSATE SODIUM 100 MG PO CAPS: 100.0000 mg | ORAL_CAPSULE | Freq: Two times a day (BID) | ORAL | Status: DC

## 2013-07-10 MED ORDER — OMEGA-3-ACID ETHYL ESTERS 1 G PO CAPS: 2.0000 g | ORAL_CAPSULE | Freq: Two times a day (BID) | ORAL | Status: DC

## 2013-07-10 MED ORDER — OLMESARTAN MEDOXOMIL-HCTZ 40-12.5 MG PO TABS: 1.0000 | ORAL_TABLET | Freq: Every day | ORAL | Status: DC

## 2013-07-10 NOTE — Progress Notes (Signed)
Patient ID: Jose Brady, male   DOB: 01/19/1951, 63 y.o.   MRN: [id number]   CC:  HPI: 63 year old male here for followup he has history of hypertension, diabetes, he was last seen here on 12/25/12. The patient had an EF of 60% on 2-D echo that was done at [facility] in [place]. He has a history of hypothyroidism, hypertension, diabetes. Today he is primarily complaining of left knee pain, no swelling no erythema of the knee joint. The patient has no difficulty ambulating, currently wearing kneecap. He has previously received cortisone injections from Dr. Gilmer Morraper's office for pes anserine bursitis. The patient has diclofenac and tramadol on his medication list but has not taken either medication is not taken anything over-the-counter.  He denies any chest pain shortness of breath blurry vision dependent edema He does complain of dysuria when he urinates for the last couple of days. He is also complaining of irritation of his hemorrhoids for the last several weeks, denies any rectal bleeding  Allergies  Allergen Reactions  . Penicillins Hives   Past Medical History  Diagnosis Date  . Hypertension    Current Outpatient Prescriptions on File Prior to Visit  Medication Sig Dispense Refill  . [DISCONTINUED] irbesartan (AVAPRO) 300 MG tablet Take 1 tablet (300 mg total) by mouth at bedtime.  30 tablet  5  . [DISCONTINUED] omeprazole (PRILOSEC) 20 MG capsule Take 1 capsule (20 mg total) by mouth daily.  30 capsule  5   No current facility-administered medications on file prior to visit.   Family History  Problem Relation Age of Onset  . Hypertension Mother   . Hypertension Father    History   Social History  . Marital Status: Married    Spouse Name: N/A    Number of Children: N/A  . Years of Education: N/A   Occupational History  . Not on file.   Social History Main Topics  . Smoking status: Never Smoker   . Smokeless tobacco: Not on file  . Alcohol Use: Not  on file  . Drug Use: Not on file  . Sexual Activity: Not on file   Other Topics Concern  . Not on file   Social History Narrative  . No narrative on file    Review of Systems  Constitutional: Negative for fever, chills, diaphoresis, activity change, appetite change and fatigue.  HENT: Negative for ear pain, nosebleeds, congestion, facial swelling, rhinorrhea, neck pain, neck stiffness and ear discharge.   Eyes: Negative for pain, discharge, redness, itching and visual disturbance.  Respiratory: Negative for cough, choking, chest tightness, shortness of breath, wheezing and stridor.   Cardiovascular: Negative for chest pain, palpitations and leg swelling.  Gastrointestinal: As in history of present illness Genitourinary: Negative for dysuria, urgency, frequency, hematuria, flank pain, decreased urine volume, difficulty urinating and dyspareunia.  Musculoskeletal: As in history of present illness.  Neurological: Negative for dizziness, tremors, seizures, syncope, facial asymmetry, speech difficulty, weakness, light-headedness, numbness and headaches.  Hematological: Negative for adenopathy. Does not bruise/bleed easily.  Psychiatric/Behavioral: Negative for hallucinations, behavioral problems, confusion, dysphoric mood, decreased concentration and agitation.    Objective:   Filed Vitals:   07/10/13 1136  BP: 118/86  Pulse: 79  Temp: 98 F (36.7 C)  Resp: 16    Physical Exam  Constitutional: Appears well-developed and well-nourished. No distress.  HENT: Normocephalic. External right and left ear normal. Oropharynx is clear and moist.  Eyes: Conjunctivae and EOM are normal. PERRLA, no scleral icterus.  Neck: Normal  ROM. Neck supple. No JVD. No tracheal deviation. No thyromegaly.  CVS: RRR, S1/S2 +, no murmurs, no gallops, no carotid bruit.  Pulmonary: Effort and breath sounds normal, no stridor, rhonchi, wheezes, rales.  Abdominal: Soft. BS +,  no distension, tenderness,  rebound or guarding.  Musculoskeletal: Normal range of motion. Point tenderness of medial aspect of the left knee.  Lymphadenopathy: No lymphadenopathy noted, cervical, inguinal. Neuro: Alert. Normal reflexes, muscle tone coordination. No cranial nerve deficit. Skin: Skin is warm and dry. No rash noted. Not diaphoretic. No erythema. No pallor.  Psychiatric: Normal mood and affect. Behavior, judgment, thought content normal.   Lab Results  Component Value Date   WBC 7.2 06/17/2012   HGB 16.2 06/17/2012   HCT 44.5 06/17/2012   MCV 83.6 06/17/2012   PLT 235 06/17/2012   Lab Results  Component Value Date   CREATININE 1.02 06/17/2012   BUN 12 06/17/2012   NA 136 06/17/2012   K 4.2 06/17/2012   CL 101 06/17/2012   CO2 25 06/17/2012    Lab Results  Component Value Date   HGBA1C 6.0* 06/17/2012   Lipid Panel     Component Value Date/Time   CHOL 194 06/17/2012 1130   TRIG 376* 06/17/2012 1130   HDL 37* 06/17/2012 1130   CHOLHDL 5.2 06/17/2012 1130   VLDL 75* 06/17/2012 1130   LDLCALC 82 06/17/2012 1130       Assessment and plan:   Patient Active Problem List   Diagnosis Date Noted  . Hypertension 06/17/2012  . Type 2 diabetes mellitus 06/17/2012  . Dyslipidemia 06/17/2012  . Hypothyroidism 06/17/2012  . Vitamin D insufficiency 06/17/2012   Diabetes We'll check A1c Continue metformin and Amaryl Ophthalmology referral for retinal scan      Knee pain Because of his previous history the patient will be referred back to Dr. Margaretha Sheffieldraper Patient recommended to take diclofenac for pain Sports medicine referral  Hypertension,  Continue Benicar/HCTZ, metoprolol Renal panel today    Hypothyroidism Check TSH and free T4 and refill Synthroid   Patient followup in 3-4 months for diabetes, hypertension  The patient was given clear instructions to go to ER or return to medical center if symptoms don't improve, worsen or new problems develop. The patient verbalized understanding. The patient  was told to call to get any lab results if not heard anything in the next week.

## 2013-07-10 NOTE — Progress Notes (Signed)
Pt is here following up on her HTN and diabetes. Pt reports that for one month that when he urinates it burns. Pt states that for 9 months his left knee has been in constant pain. Pt is currently getting cortisone shots.

## 2013-07-11 LAB — CBC WITH DIFFERENTIAL/PLATELET
BASOS ABS: 0 10*3/uL (ref 0.0–0.1)
Basophils Relative: 0 % (ref 0–1)
Eosinophils Absolute: 0.1 10*3/uL (ref 0.0–0.7)
Eosinophils Relative: 2 % (ref 0–5)
HCT: 41.3 % (ref 39.0–52.0)
Hemoglobin: 14.5 g/dL (ref 13.0–17.0)
Lymphocytes Relative: 31 % (ref 12–46)
Lymphs Abs: 1.7 10*3/uL (ref 0.7–4.0)
MCH: 30 pg (ref 26.0–34.0)
MCHC: 35.1 g/dL (ref 30.0–36.0)
MCV: 85.3 fL (ref 78.0–100.0)
Monocytes Absolute: 0.7 10*3/uL (ref 0.1–1.0)
Monocytes Relative: 12 % (ref 3–12)
NEUTROS ABS: 3.1 10*3/uL (ref 1.7–7.7)
NEUTROS PCT: 55 % (ref 43–77)
Platelets: 246 10*3/uL (ref 150–400)
RBC: 4.84 MIL/uL (ref 4.22–5.81)
RDW: 13.8 % (ref 11.5–15.5)
WBC: 5.6 10*3/uL (ref 4.0–10.5)

## 2013-07-11 LAB — T4, FREE: Free T4: 1.16 ng/dL (ref 0.80–1.80)

## 2013-07-11 LAB — COMPLETE METABOLIC PANEL WITH GFR
ALBUMIN: 4.4 g/dL (ref 3.5–5.2)
ALK PHOS: 75 U/L (ref 39–117)
ALT: 41 U/L (ref 0–53)
AST: 35 U/L (ref 0–37)
BUN: 21 mg/dL (ref 6–23)
CO2: 25 mEq/L (ref 19–32)
Calcium: 9.3 mg/dL (ref 8.4–10.5)
Chloride: 100 mEq/L (ref 96–112)
Creat: 1.41 mg/dL — ABNORMAL HIGH (ref 0.50–1.35)
GFR, Est African American: 60 mL/min
GFR, Est Non African American: 52 mL/min — ABNORMAL LOW
Glucose, Bld: 124 mg/dL — ABNORMAL HIGH (ref 70–99)
POTASSIUM: 4.4 meq/L (ref 3.5–5.3)
SODIUM: 136 meq/L (ref 135–145)
Total Bilirubin: 0.8 mg/dL (ref 0.2–1.2)
Total Protein: 7.1 g/dL (ref 6.0–8.3)

## 2013-07-11 LAB — LIPID PANEL
Cholesterol: 209 mg/dL — ABNORMAL HIGH (ref 0–200)
HDL: 37 mg/dL — AB (ref >39)
LDL CALC: 100 mg/dL — AB (ref 0–99)
TRIGLYCERIDES: 360 mg/dL — AB (ref <150)
Total CHOL/HDL Ratio: 5.6 Ratio
VLDL: 72 mg/dL — ABNORMAL HIGH (ref 0–40)

## 2013-07-11 LAB — TSH: TSH: 5.562 u[IU]/mL — AB (ref 0.350–4.500)

## 2013-07-11 LAB — VITAMIN D 25 HYDROXY (VIT D DEFICIENCY, FRACTURES): Vit D, 25-Hydroxy: 19 ng/mL — ABNORMAL LOW (ref 30–89)

## 2013-07-29 ENCOUNTER — Ambulatory Visit: Payer: No Typology Code available for payment source | Admitting: Sports Medicine

## 2013-08-03 ENCOUNTER — Ambulatory Visit (INDEPENDENT_AMBULATORY_CARE_PROVIDER_SITE_OTHER): Payer: No Typology Code available for payment source | Admitting: Sports Medicine

## 2013-08-03 ENCOUNTER — Encounter: Payer: Self-pay | Admitting: Sports Medicine

## 2013-08-03 VITALS — BP 114/82 | Ht 63.0 in | Wt 158.0 lb

## 2013-08-03 DIAGNOSIS — E119 Type 2 diabetes mellitus without complications: Secondary | ICD-10-CM

## 2013-08-03 DIAGNOSIS — M545 Low back pain, unspecified: Secondary | ICD-10-CM

## 2013-08-03 DIAGNOSIS — E039 Hypothyroidism, unspecified: Secondary | ICD-10-CM

## 2013-08-03 DIAGNOSIS — E559 Vitamin D deficiency, unspecified: Secondary | ICD-10-CM

## 2013-08-03 DIAGNOSIS — I10 Essential (primary) hypertension: Secondary | ICD-10-CM

## 2013-08-03 DIAGNOSIS — Z23 Encounter for immunization: Secondary | ICD-10-CM

## 2013-08-03 DIAGNOSIS — M1710 Unilateral primary osteoarthritis, unspecified knee: Secondary | ICD-10-CM

## 2013-08-03 DIAGNOSIS — E785 Hyperlipidemia, unspecified: Secondary | ICD-10-CM

## 2013-08-03 DIAGNOSIS — IMO0002 Reserved for concepts with insufficient information to code with codable children: Secondary | ICD-10-CM

## 2013-08-03 DIAGNOSIS — M5137 Other intervertebral disc degeneration, lumbosacral region: Secondary | ICD-10-CM

## 2013-08-03 MED ORDER — METHYLPREDNISOLONE ACETATE 80 MG/ML IJ SUSP
80.0000 mg | Freq: Once | INTRAMUSCULAR | Status: AC
  Administered 2013-08-03: 80 mg via INTRAMUSCULAR

## 2013-08-03 NOTE — Progress Notes (Signed)
   Subjective:    Patient ID: Jose Brady, male    DOB: [birth year], 63 y.o.   MRN: [id number]  HPI chief complaint: Low back pain  Patient comes in today with returning right-sided low back pain. He has a well-documented history of L5-S1 spondylolisthesis with associated degenerative disc disease. Main complaint is right-sided low back pain with activity. No radiating pain into his leg. No associated numbness or tingling. No recent trauma. No change in bowel or bladder. No pain on the left side of his low back or into his left leg. He was seen in the office with a similar complaint last fall. An 80 mg IM Depo-Medrol injection was administered which helped him tremendously. He denies groin pain.    Review of Systems     Objective:   Physical Exam Well-developed, well-nourished. No acute distress. Awake alert and oriented x3. Vital signs are reviewed.  Lumbar spine: There is pain with extension. No pain with forward flexion. No bony tenderness to palpation along the lumbar midline. No paraspinal musculature spasm. Negative straight leg raise bilaterally. Strength is 5/5 both lower extremities with reflexes trace but equal at the Achilles and patellar tendons. Sensation is intact to light touch grossly. No atrophy. Walking without a limp.       Assessment & Plan:  Right-sided low back pain with x-ray evidence of grade 1 L5-S1 spondylolisthesis and associated degenerative disc disease  Given his excellent response to previous IM Depo-Medrol injection we will repeat that today. He is injected with 80 mg. Return to the office in 4 weeks. He is instructed to avoid any type of extension exercise. If symptoms persist at followup I would consider referral to [facility] for fluoroscopically guided injection. His pain may in fact be facet mediated more so than discogenic. Call with questions or concerns in the interim.

## 2013-08-03 NOTE — Addendum Note (Signed)
Addended by: Annita BrodMOORE, Deiona Hooper C on: 08/03/2013 03:18 PM   Modules accepted: Orders

## 2013-08-06 ENCOUNTER — Ambulatory Visit: Payer: No Typology Code available for payment source | Admitting: Home Health Services

## 2013-09-07 ENCOUNTER — Ambulatory Visit: Payer: No Typology Code available for payment source | Admitting: Sports Medicine

## 2013-09-15 ENCOUNTER — Telehealth: Payer: Self-pay | Admitting: *Deleted

## 2013-09-15 NOTE — Telephone Encounter (Signed)
[facility] stating their pharmacy can no longer get Protonix at no charge. The pharmacy wants to know if you would consider changing to Nexium. The pharmacy can provide the Nexium at no charge. If so please send new prescription. Annamaria Hellingose,Jamie Renee, RN

## 2013-10-09 ENCOUNTER — Encounter: Payer: Self-pay | Admitting: Internal Medicine

## 2013-10-09 ENCOUNTER — Ambulatory Visit: Payer: No Typology Code available for payment source | Attending: Internal Medicine | Admitting: Internal Medicine

## 2013-10-09 VITALS — BP 120/77 | HR 81 | Temp 98.3°F | Resp 16

## 2013-10-09 DIAGNOSIS — E119 Type 2 diabetes mellitus without complications: Secondary | ICD-10-CM | POA: Insufficient documentation

## 2013-10-09 DIAGNOSIS — M5380 Other specified dorsopathies, site unspecified: Secondary | ICD-10-CM | POA: Insufficient documentation

## 2013-10-09 DIAGNOSIS — E781 Pure hyperglyceridemia: Secondary | ICD-10-CM | POA: Insufficient documentation

## 2013-10-09 DIAGNOSIS — K219 Gastro-esophageal reflux disease without esophagitis: Secondary | ICD-10-CM | POA: Insufficient documentation

## 2013-10-09 DIAGNOSIS — E038 Other specified hypothyroidism: Secondary | ICD-10-CM

## 2013-10-09 DIAGNOSIS — E039 Hypothyroidism, unspecified: Secondary | ICD-10-CM | POA: Insufficient documentation

## 2013-10-09 DIAGNOSIS — I10 Essential (primary) hypertension: Secondary | ICD-10-CM

## 2013-10-09 DIAGNOSIS — Z1211 Encounter for screening for malignant neoplasm of colon: Secondary | ICD-10-CM

## 2013-10-09 DIAGNOSIS — M62830 Muscle spasm of back: Secondary | ICD-10-CM

## 2013-10-09 DIAGNOSIS — E139 Other specified diabetes mellitus without complications: Secondary | ICD-10-CM

## 2013-10-09 DIAGNOSIS — E089 Diabetes mellitus due to underlying condition without complications: Secondary | ICD-10-CM

## 2013-10-09 LAB — GLUCOSE, POCT (MANUAL RESULT ENTRY): POC GLUCOSE: 238 mg/dL — AB (ref 70–99)

## 2013-10-09 LAB — POCT GLYCOSYLATED HEMOGLOBIN (HGB A1C): HEMOGLOBIN A1C: 6.4

## 2013-10-09 MED ORDER — METOPROLOL SUCCINATE ER 50 MG PO TB24: 50.0000 mg | ORAL_TABLET | Freq: Every day | ORAL | Status: DC

## 2013-10-09 MED ORDER — CYCLOBENZAPRINE HCL 10 MG PO TABS: 10.0000 mg | ORAL_TABLET | Freq: Every day | ORAL | Status: DC

## 2013-10-09 NOTE — Progress Notes (Signed)
Patient here for routine follow up on his DM

## 2013-10-09 NOTE — Patient Instructions (Signed)
Fat and Cholesterol Control Diet Fat and cholesterol levels in your blood and organs are influenced by your diet. High levels of fat and cholesterol may lead to diseases of the heart, small and large blood vessels, gallbladder, liver, and pancreas. CONTROLLING FAT AND CHOLESTEROL WITH DIET Although exercise and lifestyle factors are important, your diet is key. That is because certain foods are known to raise cholesterol and others to lower it. The goal is to balance foods for their effect on cholesterol and more importantly, to replace saturated and trans fat with other types of fat, such as monounsaturated fat, polyunsaturated fat, and omega-3 fatty acids. On average, a person should consume no more than 15 to 17 g of saturated fat daily. Saturated and trans fats are considered "bad" fats, and they will raise LDL cholesterol. Saturated fats are primarily found in animal products such as meats, butter, and cream. However, that does not mean you need to give up all your favorite foods. Today, there are good tasting, low-fat, low-cholesterol substitutes for most of the things you like to eat. Choose low-fat or nonfat alternatives. Choose round or loin cuts of red meat. These types of cuts are lowest in fat and cholesterol. Chicken (without the skin), fish, veal, and ground turkey breast are great choices. Eliminate fatty meats, such as hot dogs and salami. Even shellfish have little or no saturated fat. Have a 3 oz (85 g) portion when you eat lean meat, poultry, or fish. Trans fats are also called "partially hydrogenated oils." They are oils that have been scientifically manipulated so that they are solid at room temperature resulting in a longer shelf life and improved taste and texture of foods in which they are added. Trans fats are found in stick margarine, some tub margarines, cookies, crackers, and baked goods.  When baking and cooking, oils are a great substitute for butter. The monounsaturated oils are  especially beneficial since it is believed they lower LDL and raise HDL. The oils you should avoid entirely are saturated tropical oils, such as coconut and palm.  Remember to eat a lot from food groups that are naturally free of saturated and trans fat, including fish, fruit, vegetables, beans, grains (barley, rice, couscous, bulgur wheat), and pasta (without cream sauces).  IDENTIFYING FOODS THAT LOWER FAT AND CHOLESTEROL  Soluble fiber may lower your cholesterol. This type of fiber is found in fruits such as apples, vegetables such as broccoli, potatoes, and carrots, legumes such as beans, peas, and lentils, and grains such as barley. Foods fortified with plant sterols (phytosterol) may also lower cholesterol. You should eat at least 2 g per day of these foods for a cholesterol lowering effect.  Read package labels to identify low-saturated fats, trans fat free, and low-fat foods at the supermarket. Select cheeses that have only 2 to 3 g saturated fat per ounce. Use a heart-healthy tub margarine that is free of trans fats or partially hydrogenated oil. When buying baked goods (cookies, crackers), avoid partially hydrogenated oils. Breads and muffins should be made from whole grains (whole-wheat or whole oat flour, instead of "flour" or "enriched flour"). Buy non-creamy canned soups with reduced salt and no added fats.  FOOD PREPARATION TECHNIQUES  Never deep-fry. If you must fry, either stir-fry, which uses very little fat, or use non-stick cooking sprays. When possible, broil, bake, or roast meats, and steam vegetables. Instead of putting butter or margarine on vegetables, use lemon and herbs, applesauce, and cinnamon (for squash and sweet potatoes). Use nonfat   yogurt, salsa, and low-fat dressings for salads.  LOW-SATURATED FAT / LOW-FAT FOOD SUBSTITUTES Meats / Saturated Fat (g)  Avoid: Steak, marbled (3 oz/85 g) / 11 g  Choose: Steak, lean (3 oz/85 g) / 4 g  Avoid: Hamburger (3 oz/85 g) / 7  g  Choose: Hamburger, lean (3 oz/85 g) / 5 g  Avoid: Ham (3 oz/85 g) / 6 g  Choose: Ham, lean cut (3 oz/85 g) / 2.4 g  Avoid: Chicken, with skin, dark meat (3 oz/85 g) / 4 g  Choose: Chicken, skin removed, dark meat (3 oz/85 g) / 2 g  Avoid: Chicken, with skin, light meat (3 oz/85 g) / 2.5 g  Choose: Chicken, skin removed, light meat (3 oz/85 g) / 1 g Dairy / Saturated Fat (g)  Avoid: Whole milk (1 cup) / 5 g  Choose: Low-fat milk, 2% (1 cup) / 3 g  Choose: Low-fat milk, 1% (1 cup) / 1.5 g  Choose: Skim milk (1 cup) / 0.3 g  Avoid: Hard cheese (1 oz/28 g) / 6 g  Choose: Skim milk cheese (1 oz/28 g) / 2 to 3 g  Avoid: Cottage cheese, 4% fat (1 cup) / 6.5 g  Choose: Low-fat cottage cheese, 1% fat (1 cup) / 1.5 g  Avoid: Ice cream (1 cup) / 9 g  Choose: Sherbet (1 cup) / 2.5 g  Choose: Nonfat frozen yogurt (1 cup) / 0.3 g  Choose: Frozen fruit bar / trace  Avoid: Whipped cream (1 tbs) / 3.5 g  Choose: Nondairy whipped topping (1 tbs) / 1 g Condiments / Saturated Fat (g)  Avoid: Mayonnaise (1 tbs) / 2 g  Choose: Low-fat mayonnaise (1 tbs) / 1 g  Avoid: Butter (1 tbs) / 7 g  Choose: Extra light margarine (1 tbs) / 1 g  Avoid: Coconut oil (1 tbs) / 11.8 g  Choose: Olive oil (1 tbs) / 1.8 g  Choose: Corn oil (1 tbs) / 1.7 g  Choose: Safflower oil (1 tbs) / 1.2 g  Choose: Sunflower oil (1 tbs) / 1.4 g  Choose: Soybean oil (1 tbs) / 2.4 g  Choose: Canola oil (1 tbs) / 1 g Document Released: 03/12/2005 Document Revised: 07/07/2012 Document Reviewed: 08/31/2010 ExitCare Patient Information 2015 [employer]. This information is not intended to replace advice given to you by your health care provider. Make sure you discuss any questions you have with your health care provider. Diabetes Mellitus and Food It is important for you to manage your blood sugar (glucose) level. Your blood glucose level can be greatly affected by what you eat. Eating healthier  foods in the appropriate amounts throughout the day at about the same time each day will help you control your blood glucose level. It can also help slow or prevent worsening of your diabetes mellitus. Healthy eating may even help you improve the level of your blood pressure and reach or maintain a healthy weight.  HOW CAN FOOD AFFECT ME? Carbohydrates Carbohydrates affect your blood glucose level more than any other type of food. Your dietitian will help you determine how many carbohydrates to eat at each meal and teach you how to count carbohydrates. Counting carbohydrates is important to keep your blood glucose at a healthy level, especially if you are using insulin or taking certain medicines for diabetes mellitus. Alcohol Alcohol can cause sudden decreases in blood glucose (hypoglycemia), especially if you use insulin or take certain medicines for diabetes mellitus. Hypoglycemia can be a life-threatening   condition. Symptoms of hypoglycemia (sleepiness, dizziness, and disorientation) are similar to symptoms of having too much alcohol.  If your health care provider has given you approval to drink alcohol, do so in moderation and use the following guidelines:  Women should not have more than one drink per day, and men should not have more than two drinks per day. One drink is equal to:  12 oz of beer.  5 oz of wine.  1 oz of hard liquor.  Do not drink on an empty stomach.  Keep yourself hydrated. Have water, diet soda, or unsweetened iced tea.  Regular soda, juice, and other mixers might contain a lot of carbohydrates and should be counted. WHAT FOODS ARE NOT RECOMMENDED? As you make food choices, it is important to remember that all foods are not the same. Some foods have fewer nutrients per serving than other foods, even though they might have the same number of calories or carbohydrates. It is difficult to get your body what it needs when you eat foods with fewer nutrients. Examples of  foods that you should avoid that are high in calories and carbohydrates but low in nutrients include:  Trans fats (most processed foods list trans fats on the Nutrition Facts label).  Regular soda.  Juice.  Candy.  Sweets, such as cake, pie, doughnuts, and cookies.  Fried foods. WHAT FOODS CAN I EAT? Have nutrient-rich foods, which will nourish your body and keep you healthy. The food you should eat also will depend on several factors, including:  The calories you need.  The medicines you take.  Your weight.  Your blood glucose level.  Your blood pressure level.  Your cholesterol level. You also should eat a variety of foods, including:  Protein, such as meat, poultry, fish, tofu, nuts, and seeds (lean animal proteins are best).  Fruits.  Vegetables.  Dairy products, such as milk, cheese, and yogurt (low fat is best).  Breads, grains, pasta, cereal, rice, and beans.  Fats such as olive oil, trans fat-free margarine, canola oil, avocado, and olives. DOES EVERYONE WITH DIABETES MELLITUS HAVE THE SAME MEAL PLAN? Because every person with diabetes mellitus is different, there is not one meal plan that works for everyone. It is very important that you meet with a dietitian who will help you create a meal plan that is just right for you. Document Released: 12/07/2004 Document Revised: 03/17/2013 Document Reviewed: 02/06/2013 Exit[employer] Patient Information 2015 [employer]. This information is not intended to replace advice given to you by your health care provider. Make sure you discuss any questions you have with your health care provider.  

## 2013-10-09 NOTE — Progress Notes (Signed)
MRN: [id number] Name: Jose Brady  Sex: male Age: 63 y.o. DOB: 02-Feb-1951  Allergies: Penicillins  Chief Complaint  Patient presents with  . Follow-up    dm    HPI: Patient is 63 y.o. male who has to of hypertension diabetes hypothyroidism hypertriglyceridemia, patient comes today for followup has been compliant in taking medication he's taking metformin 500 mg twice a day as well as Amaryl 2 mg 3 times a day, denies any hypoglycemic symptoms his A1c today 6.4%, he also takes Benicar and Toprol 50 mg and is requesting refill on his medication, patient never had a colonoscopy done he reported to have some lower back pain denies any incontinence denies any shooting pain down to the legs, for hypothyroidism he's taking; daily, previous blood work noticed borderline abnormal TSH level but free T4 was normal patient denies any change in weight appetite denies constipation.  Past Medical History  Diagnosis Date  . Hypertension     History reviewed. No pertinent past surgical history.    Medication List       This list is accurate as of: 10/09/13 12:24 PM.  Always use your most recent med list.               cyclobenzaprine 10 MG tablet  Commonly known as:  FLEXERIL  Take 1 tablet (10 mg total) by mouth at bedtime.     diclofenac 75 MG EC tablet  Commonly known as:  VOLTAREN  Take one tab BID PRN     docusate sodium 100 MG capsule  Commonly known as:  COLACE  Take 1 capsule (100 mg total) by mouth 2 (two) times daily.     glimepiride 2 MG tablet  Commonly known as:  AMARYL  Take 1 tablet (2 mg total) by mouth 2 (two) times daily.     metFORMIN 500 MG 24 hr tablet  Commonly known as:  GLUCOPHAGE XR  Take 1 tablet (500 mg total) by mouth 2 (two) times daily with a meal.     metoprolol succinate 50 MG 24 hr tablet  Commonly known as:  TOPROL-XL  Take 1 tablet (50 mg total) by mouth daily. Take with or immediately following a meal.     olmesartan-hydrochlorothiazide 40-12.5 MG per tablet  Commonly known as:  BENICAR HCT  Take 1 tablet by mouth daily.     omega-3 acid ethyl esters 1 G capsule  Commonly known as:  LOVAZA  Take 2 capsules (2 g total) by mouth 2 (two) times daily.     PROTONIX 40 MG tablet  Generic drug:  pantoprazole  Take 1 tablet (40 mg total) by mouth daily.     SYNTHROID 25 MCG tablet  Generic drug:  levothyroxine  Take 1 tablet (25 mcg total) by mouth daily.     traMADol 50 MG tablet  Commonly known as:  ULTRAM  Take 1 tablet (50 mg total) by mouth 2 (two) times daily as needed.        Meds ordered this encounter  Medications  . cyclobenzaprine (FLEXERIL) 10 MG tablet    Sig: Take 1 tablet (10 mg total) by mouth at bedtime.    Dispense:  30 tablet    Refill:  1  . metoprolol succinate (TOPROL-XL) 50 MG 24 hr tablet    Sig: Take 1 tablet (50 mg total) by mouth daily. Take with or immediately following a meal.    Dispense:  90 tablet    Refill:  3  Immunization History  Administered Date(s) Administered  . Influenza Split 11/26/2012    Family History  Problem Relation Age of Onset  . Hypertension Mother   . Hypertension Father     History  Substance Use Topics  . Smoking status: Never Smoker   . Smokeless tobacco: Not on file  . Alcohol Use: Not on file    Review of Systems   As noted in HPI  Filed Vitals:   10/09/13 1134  BP: 120/77  Pulse: 81  Temp: 98.3 F (36.8 C)  Resp: 16    Physical Exam  Physical Exam  Constitutional: No distress.  Eyes: EOM are normal. Pupils are equal, round, and reactive to light.  Cardiovascular: Normal rate and regular rhythm.   Pulmonary/Chest: Breath sounds normal. No respiratory distress. He has no wheezes. He has no rales.  Abdominal: Soft. There is no tenderness.  Musculoskeletal: He exhibits no edema.  Lower lumbar some paraspinal tenderness    CBC    Component Value Date/Time   WBC 5.6 07/10/2013 1211   RBC 4.84  07/10/2013 1211   HGB 14.5 07/10/2013 1211   HCT 41.3 07/10/2013 1211   PLT 246 07/10/2013 1211   MCV 85.3 07/10/2013 1211   LYMPHSABS 1.7 07/10/2013 1211   MONOABS 0.7 07/10/2013 1211   EOSABS 0.1 07/10/2013 1211   BASOSABS 0.0 07/10/2013 1211    CMP     Component Value Date/Time   NA 136 07/10/2013 1211   K 4.4 07/10/2013 1211   CL 100 07/10/2013 1211   CO2 25 07/10/2013 1211   GLUCOSE 124* 07/10/2013 1211   BUN 21 07/10/2013 1211   CREATININE 1.41* 07/10/2013 1211   CREATININE 1.02 06/17/2012 1130   CALCIUM 9.3 07/10/2013 1211   PROT 7.1 07/10/2013 1211   ALBUMIN 4.4 07/10/2013 1211   AST 35 07/10/2013 1211   ALT 41 07/10/2013 1211   ALKPHOS 75 07/10/2013 1211   BILITOT 0.8 07/10/2013 1211   GFRNONAA 52* 07/10/2013 1211   GFRNONAA 76* 06/17/2012 1130   GFRAA 60 07/10/2013 1211   GFRAA 88* 06/17/2012 1130    Lab Results  Component Value Date/Time   CHOL 209* 07/10/2013 12:11 PM    No components found with this basename: hga1c    Lab Results  Component Value Date/Time   AST 35 07/10/2013 12:11 PM    Assessment and Plan  Diabetes mellitus due to underlying condition without complications - Plan:  Results for orders placed in visit on 10/09/13  GLUCOSE, POCT (MANUAL RESULT ENTRY)      Result Value Ref Range   POC Glucose 238 (*) 70 - 99 mg/dl  POCT GLYCOSYLATED HEMOGLOBIN (HGB A1C)      Result Value Ref Range   Hemoglobin A1C 6.4     Diabetes is well controlled  patient will continue with current medications will repeat blood chemistry COMPLETE METABOLIC PANEL WITH GFR  Essential hypertension - Plan: Blood pressure is well controlled will repeat blood chemistry COMPLETE METABOLIC PANEL WITH GFR, metoprolol succinate (TOPROL-XL) 50 MG 24 hr tablet  Other specified hypothyroidism - Plan: Continue with  Synthroid 25 mcg daily will repeat her TSH  Gastroesophageal reflux disease without esophagitis Patient is on Protonix.  Hypertriglyceridemia - Plan: As per patient he was not taking  lovaza, patient will start taking medication will repeat lipid panel prior to the next visit Lipid panel  Back muscle spasm - Plan: Advised patient to apply heating pad prescribed cyclobenzaprine (FLEXERIL) 10 MG tablet each bedtime  Special screening for malignant neoplasms, colon - Plan: Ambulatory referral to Gastroenterology   Health Maintenance -Colonoscopy: Referred to GI   Return in about 3 months (around 01/09/2014) for diabetes, hypertension, hypertriglycridemia.  Doris Cheadle, MD

## 2013-11-02 ENCOUNTER — Telehealth: Payer: Self-pay | Admitting: *Deleted

## 2013-11-02 DIAGNOSIS — K219 Gastro-esophageal reflux disease without esophagitis: Secondary | ICD-10-CM

## 2013-11-02 MED ORDER — ESOMEPRAZOLE MAGNESIUM 40 MG PO PACK: 40.0000 mg | PACK | Freq: Every day | ORAL | Status: DC

## 2013-11-02 NOTE — Telephone Encounter (Signed)
[facility] free. Pharmacy would like PCP to send a prescription for Nexium. Is this ok? Annamaria Hellingose,Marshawn Normoyle Renee, RN

## 2013-11-02 NOTE — Telephone Encounter (Signed)
Yes its OK to send the prescription for nexium

## 2013-12-14 ENCOUNTER — Encounter: Payer: Self-pay | Admitting: Internal Medicine

## 2013-12-28 ENCOUNTER — Ambulatory Visit: Payer: No Typology Code available for payment source | Attending: Internal Medicine

## 2013-12-28 DIAGNOSIS — E089 Diabetes mellitus due to underlying condition without complications: Secondary | ICD-10-CM

## 2013-12-28 DIAGNOSIS — E781 Pure hyperglyceridemia: Secondary | ICD-10-CM

## 2013-12-28 DIAGNOSIS — E038 Other specified hypothyroidism: Secondary | ICD-10-CM

## 2013-12-28 DIAGNOSIS — I10 Essential (primary) hypertension: Secondary | ICD-10-CM

## 2013-12-28 LAB — COMPLETE METABOLIC PANEL WITH GFR
ALT: 21 U/L (ref 0–53)
AST: 18 U/L (ref 0–37)
Albumin: 4.3 g/dL (ref 3.5–5.2)
Alkaline Phosphatase: 74 U/L (ref 39–117)
BUN: 21 mg/dL (ref 6–23)
CALCIUM: 9.3 mg/dL (ref 8.4–10.5)
CO2: 24 meq/L (ref 19–32)
CREATININE: 2.02 mg/dL — AB (ref 0.50–1.35)
Chloride: 101 mEq/L (ref 96–112)
GFR, Est African American: 39 mL/min — ABNORMAL LOW
GFR, Est Non African American: 34 mL/min — ABNORMAL LOW
GLUCOSE: 89 mg/dL (ref 70–99)
Potassium: 4.8 mEq/L (ref 3.5–5.3)
Sodium: 134 mEq/L — ABNORMAL LOW (ref 135–145)
Total Bilirubin: 0.6 mg/dL (ref 0.2–1.2)
Total Protein: 6.9 g/dL (ref 6.0–8.3)

## 2013-12-28 LAB — LIPID PANEL
Cholesterol: 172 mg/dL (ref 0–200)
HDL: 32 mg/dL — ABNORMAL LOW (ref >39)
Total CHOL/HDL Ratio: 5.4 Ratio
Triglycerides: 401 mg/dL — ABNORMAL HIGH (ref <150)

## 2013-12-28 LAB — TSH: TSH: 7.493 u[IU]/mL — AB (ref 0.350–4.500)

## 2013-12-30 ENCOUNTER — Telehealth: Payer: Self-pay | Admitting: *Deleted

## 2013-12-30 NOTE — Telephone Encounter (Signed)
Message copied by Raynelle CharyWINFREE, Rune Mendez R on Wed Dec 30, 2013 10:34 AM ------      Message from: Doris CheadleADVANI, DEEPAK      Created: Tue Dec 29, 2013  2:18 PM       Noticed abnormal lab results, call and advise patient to make a followup appointment to review the labs. ------

## 2013-12-30 NOTE — Telephone Encounter (Signed)
Left VM to return my call

## 2014-01-08 ENCOUNTER — Other Ambulatory Visit: Payer: Self-pay | Admitting: Emergency Medicine

## 2014-01-08 DIAGNOSIS — I10 Essential (primary) hypertension: Secondary | ICD-10-CM

## 2014-01-14 ENCOUNTER — Ambulatory Visit: Payer: No Typology Code available for payment source | Attending: Internal Medicine | Admitting: Internal Medicine

## 2014-01-14 ENCOUNTER — Encounter: Payer: Self-pay | Admitting: Internal Medicine

## 2014-01-14 VITALS — BP 121/77 | HR 83 | Temp 97.7°F | Resp 16 | Ht 64.0 in | Wt 162.0 lb

## 2014-01-14 DIAGNOSIS — E785 Hyperlipidemia, unspecified: Secondary | ICD-10-CM | POA: Insufficient documentation

## 2014-01-14 DIAGNOSIS — E039 Hypothyroidism, unspecified: Secondary | ICD-10-CM | POA: Insufficient documentation

## 2014-01-14 DIAGNOSIS — I10 Essential (primary) hypertension: Secondary | ICD-10-CM | POA: Insufficient documentation

## 2014-01-14 DIAGNOSIS — E119 Type 2 diabetes mellitus without complications: Secondary | ICD-10-CM

## 2014-01-14 DIAGNOSIS — E038 Other specified hypothyroidism: Secondary | ICD-10-CM

## 2014-01-14 DIAGNOSIS — Z23 Encounter for immunization: Secondary | ICD-10-CM

## 2014-01-14 LAB — POCT GLYCOSYLATED HEMOGLOBIN (HGB A1C): Hemoglobin A1C: 6.6

## 2014-01-14 LAB — GLUCOSE, POCT (MANUAL RESULT ENTRY): POC Glucose: 254 mg/dl — AB (ref 70–99)

## 2014-01-14 MED ORDER — LEVOTHYROXINE SODIUM 50 MCG PO TABS: 50.0000 ug | ORAL_TABLET | Freq: Every day | ORAL | Status: DC

## 2014-01-14 MED ORDER — GLIMEPIRIDE 2 MG PO TABS: 2.0000 mg | ORAL_TABLET | Freq: Two times a day (BID) | ORAL | Status: DC

## 2014-01-14 MED ORDER — METFORMIN HCL ER 500 MG PO TB24: 500.0000 mg | ORAL_TABLET | Freq: Two times a day (BID) | ORAL | Status: DC

## 2014-01-14 NOTE — Progress Notes (Signed)
Patient presents for his routine followup on HTN, DM2, Thyroid.

## 2014-01-14 NOTE — Progress Notes (Signed)
MRN: [id number] Name: Jose Brady  Sex: male Age: 63 y.o. DOB: 24-Mar-1951  Allergies: Penicillins  Chief Complaint  Patient presents with  . Hypertension    Patient presents for his routine followup on HTN, DM2, Thyroid.    . Diabetes  . Hypothyroidism    HPI: Patient is 63 y.o. male who has history of diabetes, hypertension, hypothyroidism, hyperlipidemia, patient had a blood work done which was reviewed with the patient noticed elevated TSH level, patient also has gained 4 pounds compared to last visit currently is taking levothyroxine 25 mcg daily, also noticed elevated triglycerides, as per patient he is not taking the prescribed dosage of lovaza, I have advised patient for compliance with the medication, his diabetes is well controlled and his A1c is 6.6%, he denies any hypoglycemic symptoms.  Past Medical History  Diagnosis Date  . Hypertension   . Diabetes mellitus without complication   . Thyroid disease     History reviewed. No pertinent past surgical history.    Medication List       This list is accurate as of: 01/14/14 10:59 AM.  Always use your most recent med list.               cyclobenzaprine 10 MG tablet  Commonly known as:  FLEXERIL  Take 1 tablet (10 mg total) by mouth at bedtime.     diclofenac 75 MG EC tablet  Commonly known as:  VOLTAREN  Take one tab BID PRN     docusate sodium 100 MG capsule  Commonly known as:  COLACE  Take 1 capsule (100 mg total) by mouth 2 (two) times daily.     esomeprazole 40 MG packet  Commonly known as:  NEXIUM  Take 40 mg by mouth daily before breakfast.     glimepiride 2 MG tablet  Commonly known as:  AMARYL  Take 1 tablet (2 mg total) by mouth 2 (two) times daily.     levothyroxine 50 MCG tablet  Commonly known as:  SYNTHROID  Take 1 tablet (50 mcg total) by mouth daily before breakfast.     metFORMIN 500 MG 24 hr tablet  Commonly known as:  GLUCOPHAGE XR  Take 1 tablet (500 mg total)  by mouth 2 (two) times daily with a meal.     metoprolol succinate 50 MG 24 hr tablet  Commonly known as:  TOPROL-XL  Take 1 tablet (50 mg total) by mouth daily. Take with or immediately following a meal.     olmesartan-hydrochlorothiazide 40-12.5 MG per tablet  Commonly known as:  BENICAR HCT  Take 1 tablet by mouth daily.     omega-3 acid ethyl esters 1 G capsule  Commonly known as:  LOVAZA  Take 2 capsules (2 g total) by mouth 2 (two) times daily.     PROTONIX 40 MG tablet  Generic drug:  pantoprazole  Take 1 tablet (40 mg total) by mouth daily.     traMADol 50 MG tablet  Commonly known as:  ULTRAM  Take 1 tablet (50 mg total) by mouth 2 (two) times daily as needed.        Meds ordered this encounter  Medications  . levothyroxine (SYNTHROID) 50 MCG tablet    Sig: Take 1 tablet (50 mcg total) by mouth daily before breakfast.    Dispense:  90 tablet    Refill:  1    Immunization History  Administered Date(s) Administered  . Influenza Split 11/26/2012    Family  History  Problem Relation Age of Onset  . Hypertension Mother   . Hypertension Father     History  Substance Use Topics  . Smoking status: Never Smoker   . Smokeless tobacco: Not on file  . Alcohol Use: Not on file    Review of Systems   As noted in HPI  Filed Vitals:   01/14/14 1017  BP: 121/77  Pulse: 83  Temp: 97.7 F (36.5 C)  Resp: 16    Physical Exam  Physical Exam  Eyes: EOM are normal. Pupils are equal, round, and reactive to light.  Cardiovascular: Normal rate and regular rhythm.   Pulmonary/Chest: Breath sounds normal. No respiratory distress. He has no wheezes. He has no rales.  Musculoskeletal: He exhibits no edema.    CBC    Component Value Date/Time   WBC 5.6 07/10/2013 1211   RBC 4.84 07/10/2013 1211   HGB 14.5 07/10/2013 1211   HCT 41.3 07/10/2013 1211   PLT 246 07/10/2013 1211   MCV 85.3 07/10/2013 1211   LYMPHSABS 1.7 07/10/2013 1211   MONOABS 0.7 07/10/2013 1211    EOSABS 0.1 07/10/2013 1211   BASOSABS 0.0 07/10/2013 1211    CMP     Component Value Date/Time   NA 134* 12/28/2013 0934   K 4.8 12/28/2013 0934   CL 101 12/28/2013 0934   CO2 24 12/28/2013 0934   GLUCOSE 89 12/28/2013 0934   BUN 21 12/28/2013 0934   CREATININE 2.02* 12/28/2013 0934   CREATININE 1.02 06/17/2012 1130   CALCIUM 9.3 12/28/2013 0934   PROT 6.9 12/28/2013 0934   ALBUMIN 4.3 12/28/2013 0934   AST 18 12/28/2013 0934   ALT 21 12/28/2013 0934   ALKPHOS 74 12/28/2013 0934   BILITOT 0.6 12/28/2013 0934   GFRNONAA 34* 12/28/2013 0934   GFRNONAA 76* 06/17/2012 1130   GFRAA 39* 12/28/2013 0934   GFRAA 88* 06/17/2012 1130    Lab Results  Component Value Date/Time   CHOL 172 12/28/2013  9:34 AM    No components found with this basename: hga1c    Lab Results  Component Value Date/Time   AST 18 12/28/2013  9:34 AM    Assessment and Plan  Other specified hypothyroidism - Plan: Last TSH level was elevated, have increased the dose of levothyroxine to 50 mcg daily, will repeat TSH level on the next visit levothyroxine (SYNTHROID) 50 MCG tablet  Essential hypertension Pressure is well controlled, continue with current meds. Advise for DASH diet.  Dyslipidemia Elevated triglycerides as per patient he has not been compliant in taking the medication, continue with her lovaza 2 g 2 times a day, will repeat her lipid panel on the next visit.  Type 2 diabetes mellitus without complication - Plan:  Results for orders placed in visit on 01/14/14  GLUCOSE, POCT (MANUAL RESULT ENTRY)      Result Value Ref Range   POC Glucose 254 (*) 70 - 99 mg/dl  POCT GLYCOSYLATED HEMOGLOBIN (HGB A1C)      Result Value Ref Range   Hemoglobin A1C 6.6      diabetes is well controlled patient will continue with current meds, will repeat A1c in 3 months.   Health Maintenance Flu shot today   Return in about 3 months (around 04/16/2014).  Doris CheadleADVANI, Zidan Helget, MD

## 2014-01-18 ENCOUNTER — Ambulatory Visit: Payer: Self-pay

## 2014-01-26 ENCOUNTER — Other Ambulatory Visit: Payer: Self-pay

## 2014-01-26 DIAGNOSIS — I10 Essential (primary) hypertension: Secondary | ICD-10-CM

## 2014-01-26 MED ORDER — METOPROLOL SUCCINATE ER 50 MG PO TB24: 50.0000 mg | ORAL_TABLET | Freq: Every day | ORAL | Status: DC

## 2014-03-24 ENCOUNTER — Encounter: Payer: Self-pay | Admitting: Sports Medicine

## 2014-03-24 ENCOUNTER — Ambulatory Visit (INDEPENDENT_AMBULATORY_CARE_PROVIDER_SITE_OTHER): Payer: No Typology Code available for payment source | Admitting: Sports Medicine

## 2014-03-24 VITALS — BP 119/80 | Ht 63.0 in | Wt 162.0 lb

## 2014-03-24 DIAGNOSIS — M545 Low back pain, unspecified: Secondary | ICD-10-CM

## 2014-03-24 DIAGNOSIS — M25562 Pain in left knee: Secondary | ICD-10-CM

## 2014-03-24 MED ORDER — METHYLPREDNISOLONE ACETATE 80 MG/ML IJ SUSP
80.0000 mg | Freq: Once | INTRAMUSCULAR | Status: AC
  Administered 2014-03-24: 80 mg via INTRAMUSCULAR

## 2014-03-24 NOTE — Progress Notes (Signed)
   Subjective:    Patient ID: Jose Brady, male    DOB: 1951-02-20, 63 y.o.   MRN: [id number]  HPI chief complaint: Back pain and bilateral knee pain  Patient comes in today complaining of returning low back pain. He was last seen in the office in May of this year. Was diagnosed with spondylolisthesis and associated degenerative disc disease. An IM cortisone injection provided him with good symptom relief for several months. Pain has now returned. It is identical in nature to what he experienced previously. He denies radiating pain into his legs. He denies associated numbness or tingling. He is complaining of bilateral knee pain left greater than right. He has a history of arthritis in the left knee. Has received cortisone injections in the past with good symptom relief. No trauma. No swelling. No mechanical symptoms. Symptoms improve at rest. He does take occasional diclofenac which seems to help.    Review of Systems     Objective:   Physical Exam Well-developed, well-nourished. No acute distress. Awake alert and oriented 3.  Lumbar spine: Good lumbar range of motion. Pain with extension. Diffuse tenderness to palpation along the paraspinal musculature throughout the thoracic and lumbar region. No spasm. Negative straight leg raise bilaterally. No focal neurological deficits of either lower extremity.  Examination of each knee shows full range of motion. No effusion. He does have tenderness to palpation along the medial joint lines bilaterally. Negative McMurray's. Good joint stability. Neurovascularly intact distally. Walking with a slight limp.       Assessment & Plan:  #1. Returning low back pain secondary to grade 1 L5-S1 spondylolisthesis and associated degenerative disc disease #2. Bilateral knee pain likely secondary to DJD  Patient has diabetes. Therefore I elected to inject him with 80 mg of Depo-Medrol IM today primarily for his back pain and hold off on  injecting his knees. He may receive some pain relief in his knees as well with this injection. However, I will schedule a follow-up appointment in 3-4 weeks and if knee pain persists we will consider repeat cortisone injections into each knee. Patient is cautioned about transient hyperglycemia with today's injection. He has a compression sleeve for his left knee. I provided him with a compression sleeve for his right knee. Call with questions or concerns prior to his follow-up visit.

## 2014-03-24 NOTE — Patient Instructions (Signed)
Come back and see me again in 3 or 4 weeks if your knees are still hurting

## 2014-04-02 ENCOUNTER — Ambulatory Visit: Payer: No Typology Code available for payment source | Attending: Internal Medicine | Admitting: Internal Medicine

## 2014-04-02 ENCOUNTER — Encounter: Payer: Self-pay | Admitting: Internal Medicine

## 2014-04-02 VITALS — BP 120/83 | HR 79 | Temp 98.0°F | Resp 16 | Wt 162.2 lb

## 2014-04-02 DIAGNOSIS — E038 Other specified hypothyroidism: Secondary | ICD-10-CM

## 2014-04-02 DIAGNOSIS — K219 Gastro-esophageal reflux disease without esophagitis: Secondary | ICD-10-CM | POA: Insufficient documentation

## 2014-04-02 DIAGNOSIS — E119 Type 2 diabetes mellitus without complications: Secondary | ICD-10-CM | POA: Insufficient documentation

## 2014-04-02 DIAGNOSIS — I10 Essential (primary) hypertension: Secondary | ICD-10-CM | POA: Insufficient documentation

## 2014-04-02 DIAGNOSIS — E139 Other specified diabetes mellitus without complications: Secondary | ICD-10-CM

## 2014-04-02 DIAGNOSIS — M549 Dorsalgia, unspecified: Secondary | ICD-10-CM | POA: Insufficient documentation

## 2014-04-02 DIAGNOSIS — M62830 Muscle spasm of back: Secondary | ICD-10-CM

## 2014-04-02 DIAGNOSIS — E039 Hypothyroidism, unspecified: Secondary | ICD-10-CM | POA: Insufficient documentation

## 2014-04-02 DIAGNOSIS — Z79899 Other long term (current) drug therapy: Secondary | ICD-10-CM | POA: Insufficient documentation

## 2014-04-02 LAB — POCT GLYCOSYLATED HEMOGLOBIN (HGB A1C): HEMOGLOBIN A1C: 6.9

## 2014-04-02 LAB — COMPLETE METABOLIC PANEL WITH GFR
ALT: 27 U/L (ref 0–53)
AST: 19 U/L (ref 0–37)
Albumin: 4.5 g/dL (ref 3.5–5.2)
Alkaline Phosphatase: 85 U/L (ref 39–117)
BILIRUBIN TOTAL: 0.7 mg/dL (ref 0.2–1.2)
BUN: 25 mg/dL — AB (ref 6–23)
CO2: 25 mEq/L (ref 19–32)
Calcium: 10 mg/dL (ref 8.4–10.5)
Chloride: 99 mEq/L (ref 96–112)
Creat: 1.5 mg/dL — ABNORMAL HIGH (ref 0.50–1.35)
GFR, EST NON AFRICAN AMERICAN: 48 mL/min — AB
GFR, Est African American: 56 mL/min — ABNORMAL LOW
Glucose, Bld: 75 mg/dL (ref 70–99)
Potassium: 5.3 mEq/L (ref 3.5–5.3)
Sodium: 136 mEq/L (ref 135–145)
Total Protein: 7.2 g/dL (ref 6.0–8.3)

## 2014-04-02 LAB — GLUCOSE, POCT (MANUAL RESULT ENTRY): POC GLUCOSE: 130 mg/dL — AB (ref 70–99)

## 2014-04-02 MED ORDER — CYCLOBENZAPRINE HCL 10 MG PO TABS: 10.0000 mg | ORAL_TABLET | Freq: Every day | ORAL | Status: DC

## 2014-04-02 MED ORDER — DICLOFENAC SODIUM 75 MG PO TBEC: DELAYED_RELEASE_TABLET | ORAL | Status: DC

## 2014-04-02 MED ORDER — OMEPRAZOLE 20 MG PO CPDR: 20.0000 mg | DELAYED_RELEASE_CAPSULE | Freq: Every day | ORAL | Status: DC

## 2014-04-02 MED ORDER — OLMESARTAN MEDOXOMIL-HCTZ 40-12.5 MG PO TABS: 1.0000 | ORAL_TABLET | Freq: Every day | ORAL | Status: DC

## 2014-04-02 MED ORDER — GLIMEPIRIDE 2 MG PO TABS: 2.0000 mg | ORAL_TABLET | Freq: Two times a day (BID) | ORAL | Status: DC

## 2014-04-02 MED ORDER — METFORMIN HCL ER 500 MG PO TB24: 500.0000 mg | ORAL_TABLET | Freq: Two times a day (BID) | ORAL | Status: DC

## 2014-04-02 MED ORDER — METOPROLOL SUCCINATE ER 50 MG PO TB24: 50.0000 mg | ORAL_TABLET | Freq: Every day | ORAL | Status: DC

## 2014-04-02 NOTE — Patient Instructions (Signed)
Diabetes Mellitus and Food It is important for you to manage your blood sugar (glucose) level. Your blood glucose level can be greatly affected by what you eat. Eating healthier foods in the appropriate amounts throughout the day at about the same time each day will help you control your blood glucose level. It can also help slow or prevent worsening of your diabetes mellitus. Healthy eating may even help you improve the level of your blood pressure and reach or maintain a healthy weight.  HOW CAN FOOD AFFECT ME? Carbohydrates Carbohydrates affect your blood glucose level more than any other type of food. Your dietitian will help you determine how many carbohydrates to eat at each meal and teach you how to count carbohydrates. Counting carbohydrates is important to keep your blood glucose at a healthy level, especially if you are using insulin or taking certain medicines for diabetes mellitus. Alcohol Alcohol can cause sudden decreases in blood glucose (hypoglycemia), especially if you use insulin or take certain medicines for diabetes mellitus. Hypoglycemia can be a life-threatening condition. Symptoms of hypoglycemia (sleepiness, dizziness, and disorientation) are similar to symptoms of having too much alcohol.  If your health care provider has given you approval to drink alcohol, do so in moderation and use the following guidelines:  Women should not have more than one drink per day, and men should not have more than two drinks per day. One drink is equal to:  12 oz of beer.  5 oz of wine.  1 oz of hard liquor.  Do not drink on an empty stomach.  Keep yourself hydrated. Have water, diet soda, or unsweetened iced tea.  Regular soda, juice, and other mixers might contain a lot of carbohydrates and should be counted. WHAT FOODS ARE NOT RECOMMENDED? As you make food choices, it is important to remember that all foods are not the same. Some foods have fewer nutrients per serving than other  foods, even though they might have the same number of calories or carbohydrates. It is difficult to get your body what it needs when you eat foods with fewer nutrients. Examples of foods that you should avoid that are high in calories and carbohydrates but low in nutrients include:  Trans fats (most processed foods list trans fats on the Nutrition Facts label).  Regular soda.  Juice.  Candy.  Sweets, such as cake, pie, doughnuts, and cookies.  Fried foods. WHAT FOODS CAN I EAT? Have nutrient-rich foods, which will nourish your body and keep you healthy. The food you should eat also will depend on several factors, including:  The calories you need.  The medicines you take.  Your weight.  Your blood glucose level.  Your blood pressure level.  Your cholesterol level. You also should eat a variety of foods, including:  Protein, such as meat, poultry, fish, tofu, nuts, and seeds (lean animal proteins are best).  Fruits.  Vegetables.  Dairy products, such as milk, cheese, and yogurt (low fat is best).  Breads, grains, pasta, cereal, rice, and beans.  Fats such as olive oil, trans fat-free margarine, canola oil, avocado, and olives. DOES EVERYONE WITH DIABETES MELLITUS HAVE THE SAME MEAL PLAN? Because every person with diabetes mellitus is different, there is not one meal plan that works for everyone. It is very important that you meet with a dietitian who will help you create a meal plan that is just right for you. Document Released: 12/07/2004 Document Revised: 03/17/2013 Document Reviewed: 02/06/2013 [employer] Patient Information 2015 [employer]. This   information is not intended to replace advice given to you by your health care provider. Make sure you discuss any questions you have with your health care provider.  

## 2014-04-02 NOTE — Progress Notes (Signed)
MRN: [id number] Name: Jose Brady  Sex: male Age: 64 y.o. DOB: 28-Jan-1951  Allergies: Penicillins  Chief Complaint  Patient presents with  . Follow-up    diabetes    HPI: Patient is 64 y.o. male who has is she of hypertension, GERD , Hypothyroidism,hyperlipidemia, diabetes comes today for followup and requesting refill on his medications, as per patient is also following up with sports medicine and is getting a steroid injections and as per patient his sugar has been recently running slightly high, also noticed his hemoglobin A1c has trended up from 6.6 to 6.9%, patient at this point does not want to increase the dose of medications will try diet modification.  Past Medical History  Diagnosis Date  . Hypertension   . Diabetes mellitus without complication   . Thyroid disease     History reviewed. No pertinent past surgical history.    Medication List       This list is accurate as of: 04/02/14 12:23 PM.  Always use your most recent med list.               cyclobenzaprine 10 MG tablet  Commonly known as:  FLEXERIL  Take 1 tablet (10 mg total) by mouth at bedtime.     diclofenac 75 MG EC tablet  Commonly known as:  VOLTAREN  Take one tab BID PRN     docusate sodium 100 MG capsule  Commonly known as:  COLACE  Take 1 capsule (100 mg total) by mouth 2 (two) times daily.     esomeprazole 40 MG packet  Commonly known as:  NEXIUM  Take 40 mg by mouth daily before breakfast.     glimepiride 2 MG tablet  Commonly known as:  AMARYL  Take 1 tablet (2 mg total) by mouth 2 (two) times daily.     levothyroxine 50 MCG tablet  Commonly known as:  SYNTHROID  Take 1 tablet (50 mcg total) by mouth daily before breakfast.     metFORMIN 500 MG 24 hr tablet  Commonly known as:  GLUCOPHAGE XR  Take 1 tablet (500 mg total) by mouth 2 (two) times daily with a meal.     metoprolol succinate 50 MG 24 hr tablet  Commonly known as:  TOPROL-XL  Take 1 tablet (50 mg  total) by mouth daily. Take with or immediately following a meal.     olmesartan-hydrochlorothiazide 40-12.5 MG per tablet  Commonly known as:  BENICAR HCT  Take 1 tablet by mouth daily.     omega-3 acid ethyl esters 1 G capsule  Commonly known as:  LOVAZA  Take 2 capsules (2 g total) by mouth 2 (two) times daily.     omeprazole 20 MG capsule  Commonly known as:  PRILOSEC  Take 1 capsule (20 mg total) by mouth daily.     PROTONIX 40 MG tablet  Generic drug:  pantoprazole  Take 1 tablet (40 mg total) by mouth daily.     traMADol 50 MG tablet  Commonly known as:  ULTRAM  Take 1 tablet (50 mg total) by mouth 2 (two) times daily as needed.        Meds ordered this encounter  Medications  . cyclobenzaprine (FLEXERIL) 10 MG tablet    Sig: Take 1 tablet (10 mg total) by mouth at bedtime.    Dispense:  30 tablet    Refill:  2  . diclofenac (VOLTAREN) 75 MG EC tablet    Sig: Take one tab BID  PRN    Dispense:  180 tablet    Refill:  2  . glimepiride (AMARYL) 2 MG tablet    Sig: Take 1 tablet (2 mg total) by mouth 2 (two) times daily.    Dispense:  90 tablet    Refill:  3  . metFORMIN (GLUCOPHAGE XR) 500 MG 24 hr tablet    Sig: Take 1 tablet (500 mg total) by mouth 2 (two) times daily with a meal.    Dispense:  90 tablet    Refill:  3  . metoprolol succinate (TOPROL-XL) 50 MG 24 hr tablet    Sig: Take 1 tablet (50 mg total) by mouth daily. Take with or immediately following a meal.    Dispense:  90 tablet    Refill:  3  . olmesartan-hydrochlorothiazide (BENICAR HCT) 40-12.5 MG per tablet    Sig: Take 1 tablet by mouth daily.    Dispense:  90 tablet    Refill:  3  . omeprazole (PRILOSEC) 20 MG capsule    Sig: Take 1 capsule (20 mg total) by mouth daily.    Dispense:  30 capsule    Refill:  3    Immunization History  Administered Date(s) Administered  . Influenza Split 11/26/2012  . Influenza,inj,Quad PF,36+ Mos 01/14/2014    Family History  Problem Relation Age of  Onset  . Hypertension Mother   . Hypertension Father     History  Substance Use Topics  . Smoking status: Never Smoker   . Smokeless tobacco: Not on file  . Alcohol Use: Not on file    Review of Systems   As noted in HPI  Filed Vitals:   04/02/14 1140  BP: 120/83  Pulse: 79  Temp: 98 F (36.7 C)  Resp: 16    Physical Exam  Physical Exam  Constitutional: No distress.  Eyes: EOM are normal. Pupils are equal, round, and reactive to light.  Cardiovascular: Normal rate and regular rhythm.   Pulmonary/Chest: Breath sounds normal. No respiratory distress. He has no wheezes. He has no rales.  Musculoskeletal: He exhibits no edema.    CBC    Component Value Date/Time   WBC 5.6 07/10/2013 1211   RBC 4.84 07/10/2013 1211   HGB 14.5 07/10/2013 1211   HCT 41.3 07/10/2013 1211   PLT 246 07/10/2013 1211   MCV 85.3 07/10/2013 1211   LYMPHSABS 1.7 07/10/2013 1211   MONOABS 0.7 07/10/2013 1211   EOSABS 0.1 07/10/2013 1211   BASOSABS 0.0 07/10/2013 1211    CMP     Component Value Date/Time   NA 134* 12/28/2013 0934   K 4.8 12/28/2013 0934   CL 101 12/28/2013 0934   CO2 24 12/28/2013 0934   GLUCOSE 89 12/28/2013 0934   BUN 21 12/28/2013 0934   CREATININE 2.02* 12/28/2013 0934   CREATININE 1.02 06/17/2012 1130   CALCIUM 9.3 12/28/2013 0934   PROT 6.9 12/28/2013 0934   ALBUMIN 4.3 12/28/2013 0934   AST 18 12/28/2013 0934   ALT 21 12/28/2013 0934   ALKPHOS 74 12/28/2013 0934   BILITOT 0.6 12/28/2013 0934   GFRNONAA 34* 12/28/2013 0934   GFRNONAA 76* 06/17/2012 1130   GFRAA 39* 12/28/2013 0934   GFRAA 88* 06/17/2012 1130    Lab Results  Component Value Date/Time   CHOL 172 12/28/2013 09:34 AM    No components found for: HGA1C  Lab Results  Component Value Date/Time   AST 18 12/28/2013 09:34 AM    Assessment and Plan  Other specified diabetes mellitus without complications - Plan:  Results for orders placed or performed in visit on 04/02/14  Glucose  (CBG)  Result Value Ref Range   POC Glucose 130.0 (A) 70 - 99 mg/dl  HgB W0J  Result Value Ref Range   Hemoglobin A1C 6.90      HgB A1c has trended up, patient wants to keep some medication and is going to try more diet modification, will repeat his A1c in 3 months, patient is currently on Amaryl and metformin.., COMPLETE METABOLIC PANEL WITH GFR  Back pain/Back muscle spasm - Plan: patient is following up with sports medicine, undergoing steroid injections.cyclobenzaprine (FLEXERIL) 10 MG tablet  Gastroesophageal reflux disease without esophagitis Advised patient for lifestyle modification, as per patient he was taking Nexium in the past but he would like to get prescription for Prilosec which as per patient helped him better with symptoms  Other specified hypothyroidism - Plan:on the last visit his thyroid medication was increased to 50 mcg daily, will repeat TSH level.  Essential hypertension - Plan: blood pressure is well controlled, continue with metoprolol succinate (TOPROL-XL) 50 MG 24 hr tablet   Health Maintenance Up-to-date with flu shot  Return in about 3 months (around 07/02/2014) for diabetes.  Doris Cheadle, MD

## 2014-04-02 NOTE — Progress Notes (Signed)
Patient here for his routine three month follow up on his diabetes And medication refills

## 2014-04-03 LAB — TSH: TSH: 3.331 u[IU]/mL (ref 0.350–4.500)

## 2014-04-06 ENCOUNTER — Telehealth: Payer: Self-pay | Admitting: *Deleted

## 2014-04-06 NOTE — Telephone Encounter (Signed)
Used [place] # [phone] Pt aware of lab results, advised to continue taking medication as prescribe

## 2014-04-06 NOTE — Telephone Encounter (Signed)
-----   Message from Doris Cheadleeepak Advani, MD sent at 04/05/2014  9:18 AM EST ----- Call and let the patient know that now his TSH level is in normal range,patient should continue with current dose of levothyroxine 50 mcg daily.

## 2014-04-12 ENCOUNTER — Ambulatory Visit (INDEPENDENT_AMBULATORY_CARE_PROVIDER_SITE_OTHER): Payer: No Typology Code available for payment source | Admitting: Sports Medicine

## 2014-04-12 ENCOUNTER — Encounter: Payer: Self-pay | Admitting: Sports Medicine

## 2014-04-12 VITALS — BP 112/78 | Ht 63.0 in | Wt 163.0 lb

## 2014-04-12 DIAGNOSIS — M7150 Other bursitis, not elsewhere classified, unspecified site: Secondary | ICD-10-CM

## 2014-04-12 DIAGNOSIS — M7050 Other bursitis of knee, unspecified knee: Secondary | ICD-10-CM

## 2014-04-12 MED ORDER — METHYLPREDNISOLONE ACETATE 40 MG/ML IJ SUSP
40.0000 mg | Freq: Once | INTRAMUSCULAR | Status: AC
  Administered 2014-04-12: 40 mg via INTRA_ARTICULAR

## 2014-04-12 NOTE — Progress Notes (Signed)
   Subjective:    Patient ID: Jose Brady, male    DOB: 1950/05/09, 64 y.o.   MRN: [id number]  HPI  Patient comes in today for follow-up. Low back pain and right knee pain have improved after recent IIM cortisone injection. Still having persistent left knee pain. Localizes his pain to the pes anserine bursa. He has had a previous cortisone injection for pes anserine bursitis and did very well with this. He is here today with an interpreter.    Review of Systems     Objective:   Physical Exam Well-developed, well-nourished. No acute distress  Left knee: Full range of motion. No effusion. There is tenderness to palpation at the pes anserine bursa. No joint line tenderness. Negative McMurray's. Good joint stability. Neurovascularly intact distally.       Assessment & Plan:  Left knee pain secondary to pes anserine bursitis  I recommended a cortisone injection today into the left pes anserine bursa. Patient agrees. Patient tolerated this without difficulty after risks and benefits were explained including the risk of hypopigmentation. Patient may increase activity as tolerated and will follow-up for ongoing or recalcitrant issues.  Consent obtained and verified. Time-out conducted. Noted no overlying erythema, induration, or other signs of local infection. Skin prepped in a sterile fashion. Topical analgesic spray: Ethyl chloride. Joint: left knee pes anserine bursa Needle: 25g 1.5 inch Completed without difficulty. Meds: 1cc (40mg ) depomedrol, 1cc 1% xylocaine  Advised to call if fevers/chills, erythema, induration, drainage, or persistent bleeding.

## 2014-04-27 ENCOUNTER — Telehealth: Payer: Self-pay | Admitting: Internal Medicine

## 2014-04-27 ENCOUNTER — Other Ambulatory Visit: Payer: Self-pay

## 2014-04-27 DIAGNOSIS — E038 Other specified hypothyroidism: Secondary | ICD-10-CM

## 2014-04-27 MED ORDER — LEVOTHYROXINE SODIUM 50 MCG PO TABS: 50.0000 ug | ORAL_TABLET | Freq: Every day | ORAL | Status: DC

## 2014-04-27 MED ORDER — OMEGA-3-ACID ETHYL ESTERS 1 G PO CAPS: 2.0000 g | ORAL_CAPSULE | Freq: Two times a day (BID) | ORAL | Status: DC

## 2014-04-27 NOTE — Telephone Encounter (Signed)
Patient has called back in to request that [facility];

## 2014-05-07 ENCOUNTER — Telehealth: Payer: Self-pay | Admitting: *Deleted

## 2014-05-07 MED ORDER — VALSARTAN-HYDROCHLOROTHIAZIDE 160-12.5 MG PO TABS: 1.0000 | ORAL_TABLET | Freq: Every day | ORAL | Status: DC

## 2014-05-07 NOTE — Telephone Encounter (Signed)
Received prior auth [facility] [place] [place] for benicar 40-12.5.  [facility] [phone]) for prior authorization for benicar 40-12.5 mg. Patience states that this request will most likely be denied since patient has not tried and failed losartan or an ACE inhibitor. Patient was switched from Diovan to benicar in June 2014 [facility]  Per PCP: D/c benicar Order Diovan 160-12.5 mg daily  [facility] [place] Selena BattenKim, pharmacist at [facility] [phone]) notified of changes  Selena BattenKim states they will notify patient when rx is ready

## 2014-05-12 ENCOUNTER — Other Ambulatory Visit: Payer: Self-pay | Admitting: Internal Medicine

## 2014-05-12 NOTE — Telephone Encounter (Signed)
Patient has come in today to request more tablets for his omega-3 acid ethyl esters (LOVAZA) 1 G capsule script; patient says he is taking four times a day and would like a higher quantity so he does not have to come back as frequently for refills; please f/u with patient about this request

## 2014-05-13 ENCOUNTER — Other Ambulatory Visit: Payer: Self-pay | Admitting: *Deleted

## 2014-05-13 MED ORDER — OMEGA-3-ACID ETHYL ESTERS 1 G PO CAPS: 2.0000 g | ORAL_CAPSULE | Freq: Two times a day (BID) | ORAL | Status: DC

## 2014-06-16 ENCOUNTER — Telehealth: Payer: Self-pay | Admitting: General Practice

## 2014-06-16 NOTE — Telephone Encounter (Signed)
Patient presents to clinic to drop off paperwork for his PCP.  Forms placed in providers box.

## 2014-06-23 ENCOUNTER — Other Ambulatory Visit: Payer: Self-pay | Admitting: Internal Medicine

## 2014-06-23 ENCOUNTER — Ambulatory Visit (INDEPENDENT_AMBULATORY_CARE_PROVIDER_SITE_OTHER): Payer: Medicare Other | Admitting: Sports Medicine

## 2014-06-23 ENCOUNTER — Encounter: Payer: Self-pay | Admitting: Sports Medicine

## 2014-06-23 ENCOUNTER — Ambulatory Visit
Admission: RE | Admit: 2014-06-23 | Discharge: 2014-06-23 | Disposition: A | Discharge status: Home / Self Care | Payer: Medicare Other | Source: Ambulatory Visit | Attending: Sports Medicine | Admitting: Sports Medicine

## 2014-06-23 ENCOUNTER — Telehealth: Payer: Self-pay | Admitting: Sports Medicine

## 2014-06-23 VITALS — BP 133/81 | HR 79 | Ht 64.0 in | Wt 160.0 lb

## 2014-06-23 DIAGNOSIS — M25551 Pain in right hip: Secondary | ICD-10-CM

## 2014-06-23 DIAGNOSIS — M4306 Spondylolysis, lumbar region: Secondary | ICD-10-CM

## 2014-06-23 DIAGNOSIS — M4317 Spondylolisthesis, lumbosacral region: Secondary | ICD-10-CM | POA: Diagnosis not present

## 2014-06-23 DIAGNOSIS — M542 Cervicalgia: Secondary | ICD-10-CM

## 2014-06-23 DIAGNOSIS — G8929 Other chronic pain: Secondary | ICD-10-CM

## 2014-06-23 MED ORDER — METHYLPREDNISOLONE ACETATE 80 MG/ML IJ SUSP
80.0000 mg | Freq: Once | INTRAMUSCULAR | Status: AC
  Administered 2014-06-23: 80 mg via INTRAMUSCULAR

## 2014-06-23 MED ORDER — GABAPENTIN 300 MG PO CAPS: 300.0000 mg | ORAL_CAPSULE | Freq: Every day | ORAL | Status: DC

## 2014-06-23 NOTE — Assessment & Plan Note (Addendum)
Etiologies include lumbar radiculopathy with known underlying DDD and DJD of lumbar spine with spondylolithesis at L5-S1 versus hip OA. X-rays with Moderate bilateral hip OA, worse on right, lumbar spondylolisthesis, 8mm of L5 on S1, lumbar facet arthropathy. -Given multiple areas of concerns, repeat 80mg  IM Depo-Medrol today -See how he responds to these various complaints to repeat trial on IM Depo Medrol 80mg , monitor blood glucose. If symptoms return or suboptimal response to IM steroid, consider MRI of the lumbar spine in anticipation of referral to Spine Ortho to discuss further options. -Add gabapentin 300mg  po qHS -Follow-up in 3-4 weeks for re-evaluation. If radicular symptoms persist, consider MRI lumbar spine. I do not think the primary etiology at this time is solely right hip OA.

## 2014-06-23 NOTE — Progress Notes (Signed)
Subjective:    Patient ID: Jose Brady, male    DOB: 1950-10-04, 64 y.o.   MRN: [id number]  HPI Jose Brady is a 64 year old male who presents for follow-up of multiple concerns today.   Right Hip Pain: First, he complains of right lateral hip and buttock pain. This has been chronic, but worsening over the past several weeks. He has been treated in the past with IM Depo-Medrol 80 mg, which he says provides approximately 3 months of symptom relief. The pain is fairly constant, but aggravated with activity such as walking. He denies any deep groin pain. The pain radiates to the level of the lateral distal thigh, but not below the knee. He denies any numbness or tingling, however endorses mild weakness. Character of the pain is a sharp quality. He denies any loss of bladder bowel, saddle anesthesia, fevers, chills, or weight loss.  Bilateral Low Back Pain: He also complains of bilateral low back pain, which he feels radiates to the right hip. He has a history of lumbar degenerative disc disease, facet arthropathy, and spondylolisthesis of L5 on S1. He denies any new injury. His low back pain has been fairly constant and worse with bending, or twisting.  Bilateral Neck Pain: Additionally, he notes bilateral lower neck pain, which he has had for many years. He says that [place] was in [place] traction therapy, which helped his symptoms. Character is an aching pain, which is aggravated if he turns his head. Occasionally this will wake him up at night if he turns on his pillow. He denies any radiation, arm weakness, numbness, or tingling.  Left Knee Pain: He was last seen for left knee pain and underwent steroid injection at the left has anserine bursa. He says that this helped his symptoms, but they have recently returned. He continues to wear a body helix compression sleeve on the left knee. He denies any knee catching, popping, or giving way.  Review of Systems 11 point  review of systems was performed and was otherwise negative unless noted in the history of present illness.    Objective:   Physical Exam BP 133/81 mmHg  Pulse 79  Ht 5\' 4"  (1.626 m)  Wt 160 lb (72.576 kg)  BMI 27.45 kg/m2 GEN: The patient is well-developed well-nourished male and in no acute distress.  He is awake alert and oriented x3. SKIN: warm and well-perfused, no rash  EXTR: No lower extremity edema or calf tenderness Neuro: Strength 5/5 globally. Sensation intact throughout. DTRs 2/4 bilaterally. 4/5 strength to right hip abduction, limited due to pain. Vasc: +2 bilateral distal pulses. No edema.  MSK: Examination of the cervical spine reveals grossly full range of motion, though somewhat stiffened and limited due to pain. No bony step-off. Bilateral paraspinal muscle tenderness at the level of C5-T1 close to the mid axial spine. Positive Spurling test. Full strength in the bilateral upper extremities. Lumbar spine range of motion slightly stiffened and limited due to pain. No bony tenderness. Examination of the right hip reveals increased pain with 15 of internal rotation and positive Faber test. Grossly full strength in the bilateral lower extremities except for the above-noted weakness to right hip abduction. No atrophy or fasciculations. No tenderness at the right greater trochanter. He is tender across the right lower back and extending down the right lateral hip in an L5 distribution. His gait is minimally antalgic, but the left knee was without swelling or induration.    Assessment & Plan:  Please see problem based assessment and plan in the problem list.

## 2014-06-23 NOTE — Telephone Encounter (Signed)
Attempted to contact patient regarding x-rays. 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 Plan: PT with cervical traction therapy. MRI if symptoms persist for consideration of facet injections with interventional spine ortho if needed. Would need a referral to specialist.  Low back/Right hip pain: Difficult to elucidate specific etiology (lumbar radiculopathy vs. Primary OA of the right hip). X-rays with Moderate bilateral hip OA, worse on right, lumbar spondylolisthesis, 8mm of L5 on S1, lumbar facet arthropathy. Plan: See how he responds to these various complaints to repeat trial on IM Depo Medrol 80mg , monitor blood glucose. If symptoms return or suboptimal response to IM steroid, consider MRI of the lumbar spine in anticipation of referral to Spine Ortho to discuss further options.

## 2014-06-23 NOTE — Assessment & Plan Note (Signed)
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 Plan: PT with cervical traction therapy. MRI if symptoms persist for consideration of facet injections with interventional spine ortho if needed. Does not seem to be radicular in nature at this time. Follow-up if needed.

## 2014-06-23 NOTE — Assessment & Plan Note (Addendum)
-  X-rays with lumbar spondylolisthesis, 8mm of L5 on S1, lumbar facet arthropathy. -Consider MRI if possible radicular symptoms persist.

## 2014-06-23 NOTE — Patient Instructions (Signed)
-  Obtain x-rays -Start physical therapy for your neck -IM Steroid given today -Start gabapentin 300mg  once nightly- sent to pharmacy -Follow-up in 3-4 weeks or sooner if needed

## 2014-06-23 NOTE — Assessment & Plan Note (Signed)
-  Obtain lumbar spine x-rays -Consider MRI if possible radicular symptoms persist.

## 2014-06-23 NOTE — Telephone Encounter (Signed)
Patient came into facility to request a med refill for levothyroxine (SYNTHROID) 50 MCG tablet, [facility] uses [facility] on [address], please f/u with pt.

## 2014-06-29 ENCOUNTER — Ambulatory Visit: Payer: Medicare Other | Attending: Sports Medicine | Admitting: Physical Therapy

## 2014-06-29 ENCOUNTER — Encounter: Payer: Self-pay | Admitting: Physical Therapy

## 2014-06-29 DIAGNOSIS — M549 Dorsalgia, unspecified: Secondary | ICD-10-CM | POA: Diagnosis not present

## 2014-06-29 DIAGNOSIS — R29898 Other symptoms and signs involving the musculoskeletal system: Secondary | ICD-10-CM | POA: Insufficient documentation

## 2014-06-29 DIAGNOSIS — M542 Cervicalgia: Secondary | ICD-10-CM | POA: Insufficient documentation

## 2014-06-29 DIAGNOSIS — G8929 Other chronic pain: Secondary | ICD-10-CM

## 2014-06-29 NOTE — Patient Instructions (Signed)
Levator Scapula Stretch, Sitting   Sit, one hand on same-side shoulder blade, other hand on head. Gently pull head down and away. Hold _20__ seconds. Repeat 5___ times per session. Do _3__ sessions per day.  Copyright  VHI. All rights reserved.  Axial Extension (Chin Tuck)   Pull chin in and lengthen back of neck. Hold __5__ seconds while counting out loud. Repeat _10_ times. Do _3___ sessions per day.  http://gt2.exer.us/449   Copyright  VHI. All rights reserved.  Posture Tips DO: - stand tall and erect - keep chin tucked in - keep head and shoulders in alignment - check posture regularly in mirror or large window - pull head back against headrest in car seat;  Change your position often.  Sit with lumbar support. DON'T: - slouch or slump while watching TV or reading - sit, stand or lie in one position  for too long;  Sitting is especially hard on the spine so if you sit at a desk/use the computer, then stand up often!   Copyright  VHI. All rights reserved.  Posture - Standing   Good posture is important. Avoid slouching and forward head thrust. Maintain curve in low back and align ears over shoul- ders, hips over ankles.  Pull your belly button in toward your back bone.   Copyright  VHI. All rights reserved.  Posture - Sitting   Sit upright, head facing forward. Try using a roll to support lower back. Keep shoulders relaxed, and avoid rounded back. Keep hips level with knees. Avoid crossing legs for long periods.   Copyright  VHI. All rights reserved.

## 2014-06-29 NOTE — Therapy (Signed)
[facility] [address] [place], [place], [address] Phone: [phone]   Fax:  [phone]  Physical Therapy Evaluation  Patient Details  Name: Jose Brady MRN: [id number] Date of Birth: Jul 24, 1950 Referring Provider:  Danelle Berry, DO  Encounter Date: 06/29/2014      PT End of Session - 06/29/14 1151    Visit Number 1   Number of Visits 16   Date for PT Re-Evaluation 08/24/14   PT Start Time 1025   PT Stop Time 1110   PT Time Calculation (min) 45 min   Activity Tolerance Patient tolerated treatment well   Behavior During Therapy [facility] for tasks assessed/performed      Past Medical History  Diagnosis Date  . Hypertension   . Diabetes mellitus without complication   . Thyroid disease     History reviewed. No pertinent past surgical history.  There were no vitals filed for this visit.  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  No   Is the patient reluctant to leave their home because of a fear of falling?  No   Home Environment   Living Enviornment Private residence   Living Arrangements Children   Available Help at Discharge Family   Type of Home House   Prior Function   Level of Independence Independent with basic ADLs   Cognition   Overall Cognitive Status Within  Functional Limits for tasks assessed   Observation/Other Assessments   Observations Pt keeps shoulders elevated during UE movement    Focus on Therapeutic Outcomes (FOTO)  FOTO 57% limited   Sensation   Light Touch Appears Intact   Coordination   Gross Motor Movements are Fluid and Coordinated Yes   Fine Motor Movements are Fluid and Coordinated Yes   Posture/Postural Control   Posture/Postural Control Postural limitations   Postural Limitations Rounded Shoulders;Forward head   Posture Comments was able to correct posture with verbal and tactile cues   ROM / Strength   AROM / PROM / Strength AROM;Strength   AROM   AROM Assessment Site Shoulder;Cervical   Right/Left Shoulder Right;Left  Both UEs AROM WFL   Cervical Flexion 56  pain at end range   Cervical Extension 35  painful   Cervical - Right Side Bend 30  painful   Cervical - Left Side Bend 35  painful   Cervical - Right Rotation 12 cm  painful   Cervical - Left Rotation 16 cm  painful   Strength   Strength Assessment Site Shoulder   Right/Left Shoulder Right;Left   Right Shoulder Flexion 4/5   Right Shoulder ABduction 4-/5   Left Shoulder Flexion 4-/5   Left Shoulder ABduction  4/5   Palpation   Palpation TTP at C3-C7 paravertebral mm, upper trapezius, and subocipital mm         [facility] Adult PT Treatment/Exercise - 06/29/14 1036    Exercises   Exercises Neck   Neck Exercises: Seated   Neck Retraction 10 reps;3 secs   Other Seated Exercise stretches for upper trap and levator scap, 5 reps 20 sec hold   Manual Therapy   Manual Therapy Manual Traction   Manual Traction 5 mins cervical manual traction, used towel  pt states that relieves his symptoms           PT Education - 06/29/14 1141    Education provided Yes   Education Details HEP: posture, upper trap and levator stretch, chin tuck, pain management    Person(s) Educated Patient   Methods Explanation;Demonstration;Tactile cues;Verbal cues    Comprehension Verbalized understanding;Returned demonstration          PT Short Term Goals - 06/29/14 1142    PT SHORT TERM GOAL #1   Title Pt will be I with HEP   Time 4   Period Weeks   Status New   PT SHORT TERM GOAL #2   Title Pt will improve cervical side bending to 40 degr with min pain for safe driving    Baseline Now 30 deg and severe pain   Time 4   Period Weeks   Status New   PT SHORT TERM GOAL #3   Title Pt will demo correct posture while in the clinic with minimal cues    Time 4   Period Weeks   Status New   PT SHORT TERM GOAL #4   Title Pt's shoulder flexion strength will improve to 4+/5 in order to be able to lift and carru abjects around 5 lbs weight for 10-15 minutes   Baseline Currently can carry for 5 minutes with increase in pain    Time 4   Period Weeks   Status New           PT Long Term Goals - 06/29/14 1146    PT LONG TERM GOAL #1   Title Pt will be I with advanced HEP   Time 8   Period Weeks   Status New   PT LONG TERM GOAL #2   Title Pt will demo and report proper posture and body mechanics at home and at work without cues   Time 8   Period Weeks   Status New   PT LONG TERM GOAL #3   Title Pt will have improved shoulder and forearm strength 5/5 throughout to promote safe lifting and carrying of objects >10lbs for up to 20 minutes   Baseline Strength 4/5   Time 8   Period Weeks   Status New   PT LONG TERM GOAL #4   Title Pt will improve cervical R rotation to 12 cm in order to drive safely and without pain    Baseline 16 cm   Time 8   Period Weeks   Status New            Plan - 06/29/14 1153    Clinical Impression Statement 64 yo male presents to OPPT with moderate neck pain secondary to mild arthritic changes, poor posture, tightness of shoulder mm, and multiple trigger points. Pt complains of low back pain that has not been assessed, recommended to talk to his MD about a referral to PT for his low back. Pt has weakness in his  shoulders and arms and will benefit from strengthening program, traction for the neck pain, soft tissue work for trigger points, and strteching tight musculature.    Pt will benefit from skilled therapeutic intervention in order to improve on the following deficits Decreased range of motion;Improper body mechanics;Postural dysfunction;Decreased endurance;Increased fascial restricitons;Pain;Impaired UE functional use;Increased muscle spasms;Decreased strength;Impaired perceived functional ability   Rehab Potential Good   Clinical Impairments Affecting Rehab Potential Diabetes, HTN   PT Frequency 2x / week   PT Duration 8 weeks   PT Treatment/Interventions ADLs/Self Care Home Management;Moist Heat;Therapeutic activities;Patient/family education;Traction;Therapeutic exercise;Passive range of motion;Manual techniques;Ultrasound;Neuromuscular re-education;Cryotherapy;Electrical Stimulation;Dry needling   PT Next Visit Plan Continue neck stretches and manual traction with soft tissue work. Intro shoulder strengthening (rows, extensions)   PT Home Exercise Plan HEP, see above for details    Consulted and Agree with Plan of Care Patient          G-Codes - 06/29/14 1207    Functional Assessment Tool Used FOTO 57% limited   Functional Limitation Changing and maintaining body position   Changing and Maintaining Body Position Current Status ([id number]) At least 40 percent but less than 60 percent impaired, limited or restricted   Changing and Maintaining Body Position Goal Status (U0454(G8982) At least 20 percent but less than 40 percent impaired, limited or restricted       Problem List Patient Active Problem List   Diagnosis Date Noted  . Spondylolisthesis at L5-S1 level 06/23/2014  . 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 Right hip pain 06/23/2014  . Neck pain 06/23/2014  . Diabetes mellitus due to underlying condition without complications 10/09/2013  . Gastroesophageal reflux disease without  esophagitis 10/09/2013  . Hypertriglyceridemia 10/09/2013  . Back muscle spasm 10/09/2013  . Hypertension 06/17/2012  . Type 2 diabetes mellitus 06/17/2012  . Dyslipidemia 06/17/2012  . Hypothyroidism 06/17/2012  . Vitamin D insufficiency 06/17/2012    Durward MallardAna Alexandru Moorer 06/29/2014, 12:12 PM  [facility] [address] [place], [place], [address] Phone: [phone]   Fax:  [phone]

## 2014-07-01 ENCOUNTER — Telehealth: Payer: Self-pay | Admitting: Internal Medicine

## 2014-07-01 NOTE — Telephone Encounter (Signed)
Patient has called in today to see if he can get a call back from the nurse concerning his medication for Synthroid 50 mg; patient states that his current insurance is not paying for the synthroid and he would like some clarification because he has been taking this medication for an extended period of time; patient can be reached [phone]

## 2014-07-02 ENCOUNTER — Telehealth: Payer: Self-pay

## 2014-07-02 DIAGNOSIS — E038 Other specified hypothyroidism: Secondary | ICD-10-CM

## 2014-07-02 MED ORDER — LEVOTHYROXINE SODIUM 50 MCG PO TABS: 50.0000 ug | ORAL_TABLET | Freq: Every day | ORAL | Status: DC

## 2014-07-02 NOTE — Telephone Encounter (Signed)
Patient called requesting a refill on his thyroid medication [facility] [place]

## 2014-07-05 ENCOUNTER — Encounter: Payer: Self-pay | Admitting: Sports Medicine

## 2014-07-05 ENCOUNTER — Ambulatory Visit (INDEPENDENT_AMBULATORY_CARE_PROVIDER_SITE_OTHER): Payer: Medicare Other | Admitting: Sports Medicine

## 2014-07-05 VITALS — BP 140/77 | HR 77 | Ht 64.0 in | Wt 160.0 lb

## 2014-07-05 DIAGNOSIS — M4317 Spondylolisthesis, lumbosacral region: Secondary | ICD-10-CM

## 2014-07-05 MED ORDER — TRAMADOL HCL 50 MG PO TABS: ORAL_TABLET | ORAL | Status: DC

## 2014-07-05 NOTE — Progress Notes (Signed)
   Subjective:    Patient ID: Jose Brady, male    DOB: [birth year], 64 y.o.   MRN: [id number]  HPI  Patient comes in today with returning low back pain. He was last seen in our office on March 30. X-rays of his lumbar spine show advanced lumbar degenerative disc disease at L5-S1 with a grade 1 degenerative spondylolisthesis. Although he has responded in the past to IM Depo-Medrol injections, an injection administered on March 30 did not help him with pain. His pain will radiate across his low back and into the lateral right hip. He denies groin pain. He denies radiating pain down his legs. He denies associated numbness or tingling. He recently started physical therapy but has only attended one session.    Review of Systems     Objective:   Physical Exam Well-developed, well-nourished. No acute distress.  Limited lumbar range of motion secondary to pain. Diffuse tenderness to palpation across the lumbosacral area. No spasm. Negative straight leg raise bilaterally. Negative log roll bilaterally. No tenderness to palpation over the right greater trochanteric bursa. No gross focal neurological deficits of either lower extremity.  X-rays are as above       Assessment & Plan:  Low back pain secondary to L5-S1 degenerative disc disease and a degenerative grade 1 spondylolisthesis  MRI to better evaluate the degenerative disc disease seen on plain film. I've given him a prescription for tramadol to take twice daily as needed for pain in addition to his diclofenac. Patient will follow-up with me 2-3 days after the MRI to discuss those findings and delineate further treatment. He is encouraged to continue with physical therapy in the interim.

## 2014-07-06 ENCOUNTER — Ambulatory Visit: Payer: Medicare Other | Admitting: Physical Therapy

## 2014-07-06 DIAGNOSIS — M542 Cervicalgia: Secondary | ICD-10-CM | POA: Diagnosis not present

## 2014-07-06 DIAGNOSIS — R29898 Other symptoms and signs involving the musculoskeletal system: Secondary | ICD-10-CM

## 2014-07-06 NOTE — Patient Instructions (Addendum)
EXTENSION: Standing - Resistance Band: Stable (Active)   Stand, right arm at side. Against yellow resistance band, draw arm backward, as far as possible, keeping elbow straight. Complete _1-3__ sets of _10__ repetitions. Perform 1-2___ sessions per day.  Copyright  VHI. All rights reserved.    http://ss.exer.us/290   Copyright  VHI. All rights reserved.  Resistive Band Rowing   With resistive band anchored in door, grasp both ends. Keeping elbows bent, pull back, squeezing shoulder blades together. Hold _1-5___ seconds. Repeat _10-30___ times. Do _1-2___ sessions per day.  http://gt2.exer.us/97   Copyright  VHI. All rights reserved.  Avoid using neck brace.

## 2014-07-06 NOTE — Therapy (Signed)
[facility] [address] [place], [place], [address] Phone: [phone]   Fax:  [phone]  Physical Therapy Treatment  Patient Details  Name: Jose Brady MRN: [id number] Date of Birth: November 05, 1950 Referring Provider:  Doris CheadleAdvani, Deepak, MD  Encounter Date: 07/06/2014      PT End of Session - 07/06/14 1322    Visit Number 2   Number of Visits 16   Date for PT Re-Evaluation 08/24/14   PT Start Time 1103   PT Stop Time 1146   PT Time Calculation (min) 43 min   Activity Tolerance Patient tolerated treatment well      Past Medical History  Diagnosis Date  . Hypertension   . Diabetes mellitus without complication   . Thyroid disease     No past surgical history on file.  There were no vitals filed for this visit.  Visit Diagnosis:  Cervical pain (neck)  Shoulder weakness  Painful cervical ROM      Subjective Assessment - 07/06/14 1306    Subjective 5/10 now.  yesterday 4-5 /10.  Constant.  Doing the stretches.  manual traction helpful   Currently in Pain? Yes   Pain Score 4    Pain Location Neck   Pain Orientation Right;Left;Mid;Posterior   Pain Descriptors / Indicators --  hurts   Pain Radiating Towards upper trap Rt   Aggravating Factors  Turning head Rt, looking straight, looking down   Effect of Pain on Daily Activities does everything with pain   Multiple Pain Sites No                       [facility] Adult PT Treatment/Exercise - 07/06/14 1100    Posture/Postural Control   Posture Comments sleeping posture suggestions   Neck Exercises: Theraband   Shoulder Extension 10 reps;Green  added to home exercise green/ blue bands    Rows 10 reps;Green  added to home exercise green/blue bands added to home.   Neck Exercises: Supine   Cervical Isometrics --  extension 5 reps 5 seconds   Cervical Rotation --  3 reps, supine active   Lateral Flexion --  3 reps supine active   Other  Supine Exercise shoulder press   Other Supine Exercise decompression, extra pillow for stretch type pain into Lt arm    Manual Therapy   Manual Therapy --  soft tissue softened, gentle stretches, manual traction supi                PT Education - 07/06/14 1142    Education provided Yes   Education Details rows, extension band green/blue, neck roll for sleeping posture   Person(s) Educated Patient   Methods Explanation;Handout   Comprehension Verbalized understanding;Returned demonstration          PT Short Term Goals - 07/06/14 1319    PT SHORT TERM GOAL #1   Title Pt will be I with HEP   Time 4   Period Weeks   Status On-going   PT SHORT TERM GOAL #2   Title Pt will improve cervical side bending to 40 degr with min pain for safe driving    Baseline no longer severe   Time 4   Period Weeks   Status On-going   PT SHORT TERM GOAL #3   Title Pt will demo correct posture while in the clinic with minimal cues    Baseline cues   Time 4   Period Weeks   Status  On-going   PT SHORT TERM GOAL #4   Title Pt's shoulder flexion strength will improve to 4+/5 in order to be able to lift and carru abjects around 5 lbs weight for 10-15 minutes   Time 4   Period Weeks   Status On-going           PT Long Term Goals - 06/29/14 1146    PT LONG TERM GOAL #1   Title Pt will be I with advanced HEP   Time 8   Period Weeks   Status New   PT LONG TERM GOAL #2   Title Pt will demo and report proper posture and body mechanics at home and at work without cues   Time 8   Period Weeks   Status New   PT LONG TERM GOAL #3   Title Pt will have improved shoulder and forearm strength 5/5 throughout to promote safe lifting and carrying of objects >10lbs for up to 20 minutes   Baseline Strength 4/5   Time 8   Period Weeks   Status New   PT LONG TERM GOAL #4   Title Pt will improve cervical R rotation to 12 cm in order to drive safely and without pain    Baseline 16 cm (chin to  acromian)   Time 8   Period Weeks   Status New               Plan - 07/06/14 1317    Clinical Impression Statement able to make progress toward home exercise goal.  Manual helpful.  reports decreased pain post session, declined heat / cold.   PT Next Visit Plan Continue neck stretches and manual traction with soft tissue work. Intro shoulder strengthening (rows, extensions) review.  Mechanical traction?   Consulted and Agree with Plan of Care Patient        Problem List Patient Active Problem List   Diagnosis Date Noted  . Spondylolisthesis at L5-S1 level 06/23/2014  . 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 Right hip pain 06/23/2014  . Neck pain 06/23/2014  . Diabetes mellitus due to underlying condition without complications 10/09/2013  . Gastroesophageal reflux disease without esophagitis 10/09/2013  . Hypertriglyceridemia 10/09/2013  . Back muscle spasm 10/09/2013  . Hypertension 06/17/2012  . Type 2 diabetes mellitus 06/17/2012  . Dyslipidemia 06/17/2012  . Hypothyroidism 06/17/2012  . Vitamin D insufficiency 06/17/2012    HARRIS,KAREN 07/06/2014, 1:26 PM  [facility] [address] [place], [place], [address] Phone: [phone]   Fax:  [phone]  Liz Beach, PTA 07/06/2014 1:26 PM Phone: [phone] Fax: [phone]

## 2014-07-07 ENCOUNTER — Telehealth: Payer: Self-pay | Admitting: *Deleted

## 2014-07-07 NOTE — Telephone Encounter (Signed)
Sent prior authorization medication request for Synthroid to Linus OrnJessica Beck,RN via [employer] message

## 2014-07-07 NOTE — Telephone Encounter (Signed)
[facility].  Will follow up with insurance company and call [facility] back

## 2014-07-09 NOTE — Progress Notes (Signed)
Communication received from [facility] that prior authorization needed for Synthroid. Patient requesting brand.  Prior authorization form completed and faxed to [employer].  Awaiting response.

## 2014-07-16 NOTE — Progress Notes (Signed)
Spoke with Destiny at [employer] regarding prior authorization for Synthroid. She indicates medication approved and processed by patient's pharmacy on 07/10/14. No other needs identified.

## 2014-07-19 ENCOUNTER — Ambulatory Visit: Payer: Medicare Other | Admitting: Sports Medicine

## 2014-07-21 ENCOUNTER — Encounter: Payer: Medicare Other | Admitting: Physical Therapy

## 2014-07-23 ENCOUNTER — Ambulatory Visit: Payer: Medicare Other | Attending: Internal Medicine | Admitting: Internal Medicine

## 2014-07-23 ENCOUNTER — Encounter: Payer: Self-pay | Admitting: Internal Medicine

## 2014-07-23 ENCOUNTER — Ambulatory Visit: Payer: Medicare Other | Admitting: Physical Therapy

## 2014-07-23 VITALS — BP 111/73 | HR 72 | Temp 98.0°F | Resp 16 | Wt 159.8 lb

## 2014-07-23 DIAGNOSIS — Z792 Long term (current) use of antibiotics: Secondary | ICD-10-CM | POA: Insufficient documentation

## 2014-07-23 DIAGNOSIS — G8929 Other chronic pain: Secondary | ICD-10-CM

## 2014-07-23 DIAGNOSIS — M542 Cervicalgia: Secondary | ICD-10-CM | POA: Diagnosis not present

## 2014-07-23 DIAGNOSIS — M549 Dorsalgia, unspecified: Secondary | ICD-10-CM

## 2014-07-23 DIAGNOSIS — E038 Other specified hypothyroidism: Secondary | ICD-10-CM

## 2014-07-23 DIAGNOSIS — I10 Essential (primary) hypertension: Secondary | ICD-10-CM

## 2014-07-23 DIAGNOSIS — K219 Gastro-esophageal reflux disease without esophagitis: Secondary | ICD-10-CM

## 2014-07-23 DIAGNOSIS — K610 Anal abscess: Secondary | ICD-10-CM | POA: Insufficient documentation

## 2014-07-23 DIAGNOSIS — E119 Type 2 diabetes mellitus without complications: Secondary | ICD-10-CM | POA: Diagnosis not present

## 2014-07-23 DIAGNOSIS — E039 Hypothyroidism, unspecified: Secondary | ICD-10-CM | POA: Diagnosis not present

## 2014-07-23 DIAGNOSIS — Z8249 Family history of ischemic heart disease and other diseases of the circulatory system: Secondary | ICD-10-CM | POA: Diagnosis not present

## 2014-07-23 DIAGNOSIS — E139 Other specified diabetes mellitus without complications: Secondary | ICD-10-CM | POA: Diagnosis not present

## 2014-07-23 DIAGNOSIS — Z79899 Other long term (current) drug therapy: Secondary | ICD-10-CM | POA: Insufficient documentation

## 2014-07-23 DIAGNOSIS — L0292 Furuncle, unspecified: Secondary | ICD-10-CM | POA: Diagnosis not present

## 2014-07-23 DIAGNOSIS — R29898 Other symptoms and signs involving the musculoskeletal system: Secondary | ICD-10-CM

## 2014-07-23 LAB — COMPLETE METABOLIC PANEL WITH GFR
ALT: 27 U/L (ref 0–53)
AST: 22 U/L (ref 0–37)
Albumin: 4.3 g/dL (ref 3.5–5.2)
Alkaline Phosphatase: 82 U/L (ref 39–117)
BILIRUBIN TOTAL: 0.7 mg/dL (ref 0.2–1.2)
BUN: 23 mg/dL (ref 6–23)
CO2: 22 meq/L (ref 19–32)
Calcium: 9.3 mg/dL (ref 8.4–10.5)
Chloride: 100 mEq/L (ref 96–112)
Creat: 1.31 mg/dL (ref 0.50–1.35)
GFR, EST AFRICAN AMERICAN: 66 mL/min
GFR, Est Non African American: 57 mL/min — ABNORMAL LOW
Glucose, Bld: 138 mg/dL — ABNORMAL HIGH (ref 70–99)
Potassium: 4.5 mEq/L (ref 3.5–5.3)
Sodium: 134 mEq/L — ABNORMAL LOW (ref 135–145)
Total Protein: 7.2 g/dL (ref 6.0–8.3)

## 2014-07-23 LAB — POCT GLYCOSYLATED HEMOGLOBIN (HGB A1C): Hemoglobin A1C: 6.8

## 2014-07-23 LAB — GLUCOSE, POCT (MANUAL RESULT ENTRY): POC Glucose: 208 mg/dl — AB (ref 70–99)

## 2014-07-23 LAB — TSH: TSH: 3.276 u[IU]/mL (ref 0.350–4.500)

## 2014-07-23 MED ORDER — SULFAMETHOXAZOLE-TRIMETHOPRIM 800-160 MG PO TABS
1.0000 | ORAL_TABLET | Freq: Two times a day (BID) | ORAL | Status: DC
Start: 2014-07-23 — End: 2014-12-14

## 2014-07-23 MED ORDER — LEVOTHYROXINE SODIUM 50 MCG PO TABS: 50.0000 ug | ORAL_TABLET | Freq: Every day | ORAL | Status: DC

## 2014-07-23 MED ORDER — METFORMIN HCL ER 500 MG PO TB24: 500.0000 mg | ORAL_TABLET | Freq: Two times a day (BID) | ORAL | Status: DC

## 2014-07-23 MED ORDER — VALSARTAN-HYDROCHLOROTHIAZIDE 160-12.5 MG PO TABS: 1.0000 | ORAL_TABLET | Freq: Every day | ORAL | Status: DC

## 2014-07-23 MED ORDER — HYDROCORTISONE 1 % EX CREA: 1.0000 "application " | TOPICAL_CREAM | Freq: Two times a day (BID) | CUTANEOUS | Status: DC

## 2014-07-23 MED ORDER — GLIMEPIRIDE 2 MG PO TABS: 2.0000 mg | ORAL_TABLET | Freq: Two times a day (BID) | ORAL | Status: DC

## 2014-07-23 MED ORDER — OMEPRAZOLE 20 MG PO CPDR: 20.0000 mg | DELAYED_RELEASE_CAPSULE | Freq: Every day | ORAL | Status: DC

## 2014-07-23 MED ORDER — METOPROLOL SUCCINATE ER 50 MG PO TB24
50.0000 mg | ORAL_TABLET | Freq: Every day | ORAL | Status: DC
Start: 2014-07-23 — End: 2014-12-14

## 2014-07-23 MED ORDER — DICLOFENAC SODIUM 75 MG PO TBEC: DELAYED_RELEASE_TABLET | ORAL | Status: DC

## 2014-07-23 NOTE — Therapy (Addendum)
[facility] [address] [place], [place], [address] Phone: [phone]   Fax:  [phone]  Physical Therapy Treatment  Patient Details  Name: Jose Brady MRN: [id number] Date of Birth: Sep 10, 1950 Referring Provider:  Doris Cheadle, MD  Encounter Date: 07/23/2014      PT End of Session - 07/23/14 1019    Visit Number 3   Number of Visits 16   Date for PT Re-Evaluation 08/24/14   PT Start Time 0930   PT Stop Time 1035   PT Time Calculation (min) 65 min   Activity Tolerance Patient tolerated treatment well;No increased pain   Behavior During Therapy [facility] for tasks assessed/performed      Past Medical History  Diagnosis Date  . Hypertension   . Diabetes mellitus without complication   . Thyroid disease     No past surgical history on file.  There were no vitals filed for this visit.  Visit Diagnosis:  Cervical pain (neck)  Shoulder weakness  Painful cervical ROM  Chronic neck and back pain      Subjective Assessment - 07/23/14 0936    Subjective No pain now.  Pain usually increases with reading over 5 min and can last a few days.  Would like traction (mechanical) today.    Currently in Pain? No/denies   Pain Score 4   4/10 at worst this past week.    Pain Location Neck   Pain Orientation Right;Posterior   Pain Descriptors / Indicators Aching   Pain Type Chronic pain   Pain Onset More than a month ago   Pain Frequency Intermittent            [facility] PT Assessment - 07/23/14 0939    AROM   Cervical Flexion 60  no pain   Cervical Extension 50  min pain Rt. side   Cervical - Right Side Bend 36  tight   Cervical - Left Side Bend 30  tight   Cervical - Right Rotation 45  no pain   Cervical - Left Rotation 45  mild pain           [facility] Adult PT Treatment/Exercise - 07/23/14 0945    Neck Exercises: Seated   Neck Retraction 10 reps;5 secs   Cervical Rotation Both;5 reps   against PT resistance   Shoulder ABduction Both;10 reps;Other (comment)  with chin tuck, red thera band   Neck Exercises: Supine   Upper Extremity D2 10 reps;Theraband;Flexion   Theraband Level (UE D2) Level 2 (Red)  with a chin tuck   Other Supine Exercise shoulder ER with chin tuck, x 15 red tera band   Traction   Type of Traction Cervical   Min (lbs) 8   Max (lbs) 15   Hold Time 60   Rest Time 15   Time 15   Manual Therapy   Manual Therapy --   Manual Traction --   Neck Exercises: Stretches   Upper Trapezius Stretch 4 reps;30 seconds   Levator Stretch 2 reps;30 seconds   Corner Stretch 2 reps;30 seconds  added to HEP     Self care: See education      PT Education - 07/23/14 1018    Education provided Yes   Education Details reading positions, HEP added corner [employer]) Educated Patient   Methods Explanation;Demonstration   Comprehension Verbalized understanding;Returned demonstration          PT Short Term Goals - 07/23/14 1020  PT SHORT TERM GOAL #1   Title Pt will be I with HEP   Status On-going   PT SHORT TERM GOAL #2   Title Pt will improve cervical side bending to 40 degr with min pain for safe driving    Status On-going   PT SHORT TERM GOAL #3   Title Pt will demo correct posture while in the clinic with minimal cues    Status On-going   PT SHORT TERM GOAL #4   Title Pt's shoulder flexion strength will improve to 4+/5 in order to be able to lift and carru abjects around 5 lbs weight for 10-15 minutes   Status On-going           PT Long Term Goals - 07/23/14 1024    PT LONG TERM GOAL #1   Title Pt will be I with advanced HEP   Status On-going   PT LONG TERM GOAL #2   Title Pt will demo and report proper posture and body mechanics at home and at work without cues   Status On-going   PT LONG TERM GOAL #3   Title Pt will have improved shoulder and forearm strength 5/5 throughout to promote safe lifting and carrying of objects  >10lbs for up to 20 minutes   Status On-going   PT LONG TERM GOAL #4   Title Pt will improve cervical R rotation to 12 cm in order to drive safely and without pain    Status Achieved   PT LONG TERM GOAL #5   Title Pt will be able to read in sitting for longer than 15 minutes without increase in pain    Baseline after 5 minutes pain increases currently   Time 8   Period Weeks   Status New           Plan - 07/23/14 1039    Clinical Impression Statement Mechanical traction is heping his neck, he feels better copmpared to first visit, but is still limited in caring groceries. Pain free AROM is improving (rotation and side ending).    PT Next Visit Pla Continue neck stretches and strengthening. Intro shoulder strengthening (rows, extensions) review.  Mechanical traction.   PT Home Exercise Plan as previous, added corner stretch   Consulted and Agree with Plan of Care Patient        Problem List Patient Active Problem List   Diagnosis Date Noted  . Spondylolisthesis at L5-S1 level 06/23/2014  . 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 Right hip pain 06/23/2014  . Neck pain 06/23/2014  . Diabetes mellitus due to underlying condition without complications 10/09/2013  . Gastroesophageal reflux disease without esophagitis 10/09/2013  . Hypertriglyceridemia 10/09/2013  . Back muscle spasm 10/09/2013  . Hypertension 06/17/2012  . Type 2 diabetes mellitus 06/17/2012  . Dyslipidemia 06/17/2012  . Hypothyroidism 06/17/2012  . Vitamin D insufficiency 06/17/2012    PAA,JENNIFER 07/23/2014, 11:26 AM  [facility] [address] [place], [place], [address] Phone: [phone]   Fax:  [phone]

## 2014-07-23 NOTE — Progress Notes (Signed)
Patient complains of having a "boil" inside his anus that  Has been there for a couple of days Patient states it is very painful and hurts to have a bowel movement

## 2014-07-23 NOTE — Progress Notes (Signed)
MRN: [id number] Name: Jose Brady  Sex: male Age: 64 y.o. DOB: 04/17/1950  Allergies: Penicillins  Chief Complaint  Patient presents with  . Recurrent Skin Infections    HPI: Patient is 64 y.o. male who has to of diabetes hypertension hyperlipidemia, hypothyroidism comes today for followup her, his hemoglobin A1c is slightly improved denies any hypoglycemic symptoms, compliant with his medications, today his major complaint is boil around his anal area which sometimes become painful, denies any bleeding per rectum denies any constipation , denies any fever chills.  Past Medical History  Diagnosis Date  . Hypertension   . Diabetes mellitus without complication   . Thyroid disease     History reviewed. No pertinent past surgical history.    Medication List       This list is accurate as of: 07/23/14 11:53 AM.  Always use your most recent med list.               BENICAR HCT 40-12.5 MG per tablet  Generic drug:  olmesartan-hydrochlorothiazide  Take 1 tablet by mouth daily.     cyclobenzaprine 10 MG tablet  Commonly known as:  FLEXERIL  Take 1 tablet (10 mg total) by mouth at bedtime.     diclofenac 75 MG EC tablet  Commonly known as:  VOLTAREN  Take one tab BID PRN     docusate sodium 100 MG capsule  Commonly known as:  COLACE  Take 1 capsule (100 mg total) by mouth 2 (two) times daily.     esomeprazole 40 MG packet  Commonly known as:  NEXIUM  Take 40 mg by mouth daily before breakfast.     gabapentin 300 MG capsule  Commonly known as:  NEURONTIN  Take 1 capsule (300 mg total) by mouth at bedtime.     glimepiride 2 MG tablet  Commonly known as:  AMARYL  Take 1 tablet (2 mg total) by mouth 2 (two) times daily.     hydrocortisone cream 1 %  Apply 1 application topically 2 (two) times daily.     levothyroxine 50 MCG tablet  Commonly known as:  SYNTHROID  Take 1 tablet (50 mcg total) by mouth daily before breakfast.     metFORMIN 500 MG  24 hr tablet  Commonly known as:  GLUCOPHAGE XR  Take 1 tablet (500 mg total) by mouth 2 (two) times daily with a meal.     metoprolol succinate 50 MG 24 hr tablet  Commonly known as:  TOPROL-XL  Take 1 tablet (50 mg total) by mouth daily. Take with or immediately following a meal.     omega-3 acid ethyl esters 1 G capsule  Commonly known as:  LOVAZA  Take 2 capsules (2 g total) by mouth 2 (two) times daily.     omeprazole 20 MG capsule  Commonly known as:  PRILOSEC  Take 1 capsule (20 mg total) by mouth daily.     sulfamethoxazole-trimethoprim 800-160 MG per tablet  Commonly known as:  BACTRIM DS,SEPTRA DS  Take 1 tablet by mouth 2 (two) times daily.     traMADol 50 MG tablet  Commonly known as:  ULTRAM  Take 2 tablets daily as needed for pain     valsartan-hydrochlorothiazide 160-12.5 MG per tablet  Commonly known as:  DIOVAN HCT  Take 1 tablet by mouth daily.        Meds ordered this encounter  Medications  . sulfamethoxazole-trimethoprim (BACTRIM DS,SEPTRA DS) 800-160 MG per tablet  Sig: Take 1 tablet by mouth 2 (two) times daily.    Dispense:  20 tablet    Refill:  0  . valsartan-hydrochlorothiazide (DIOVAN HCT) 160-12.5 MG per tablet    Sig: Take 1 tablet by mouth daily.    Dispense:  30 tablet    Refill:  2  . metoprolol succinate (TOPROL-XL) 50 MG 24 hr tablet    Sig: Take 1 tablet (50 mg total) by mouth daily. Take with or immediately following a meal.    Dispense:  90 tablet    Refill:  3  . metFORMIN (GLUCOPHAGE XR) 500 MG 24 hr tablet    Sig: Take 1 tablet (500 mg total) by mouth 2 (two) times daily with a meal.    Dispense:  90 tablet    Refill:  3  . levothyroxine (SYNTHROID) 50 MCG tablet    Sig: Take 1 tablet (50 mcg total) by mouth daily before breakfast.    Dispense:  90 tablet    Refill:  2  . glimepiride (AMARYL) 2 MG tablet    Sig: Take 1 tablet (2 mg total) by mouth 2 (two) times daily.    Dispense:  90 tablet    Refill:  3  .  diclofenac (VOLTAREN) 75 MG EC tablet    Sig: Take one tab BID PRN    Dispense:  180 tablet    Refill:  2  . omeprazole (PRILOSEC) 20 MG capsule    Sig: Take 1 capsule (20 mg total) by mouth daily.    Dispense:  30 capsule    Refill:  3  . hydrocortisone cream 1 %    Sig: Apply 1 application topically 2 (two) times daily.    Dispense:  30 g    Refill:  1    Immunization History  Administered Date(s) Administered  . Influenza Split 11/26/2012  . Influenza,inj,Quad PF,36+ Mos 01/14/2014    Family History  Problem Relation Age of Onset  . Hypertension Mother   . Hypertension Father     History  Substance Use Topics  . Smoking status: Never Smoker   . Smokeless tobacco: Not on file  . Alcohol Use: Not on file    Review of Systems   As noted in HPI  Filed Vitals:   07/23/14 1117  BP: 111/73  Pulse: 72  Temp: 98 F (36.7 C)  Resp: 16    Physical Exam  Physical Exam  Constitutional: No distress.  Eyes: EOM are normal. Pupils are equal, round, and reactive to light.  Cardiovascular: Normal rate and regular rhythm.   Pulmonary/Chest: Breath sounds normal. No respiratory distress. He has no wheezes. He has no rales.  Abdominal: Soft. There is no tenderness.  Genitourinary:  Boil noticed in perianal area, minimal tender no apparent discharge, no skin tear or hemorrhoids    CBC    Component Value Date/Time   WBC 5.6 07/10/2013 1211   RBC 4.84 07/10/2013 1211   HGB 14.5 07/10/2013 1211   HCT 41.3 07/10/2013 1211   PLT 246 07/10/2013 1211   MCV 85.3 07/10/2013 1211   LYMPHSABS 1.7 07/10/2013 1211   MONOABS 0.7 07/10/2013 1211   EOSABS 0.1 07/10/2013 1211   BASOSABS 0.0 07/10/2013 1211    CMP     Component Value Date/Time   NA 136 04/02/2014 1232   K 5.3 04/02/2014 1232   CL 99 04/02/2014 1232   CO2 25 04/02/2014 1232   GLUCOSE 75 04/02/2014 1232   BUN 25* 04/02/2014  1232   CREATININE 1.50* 04/02/2014 1232   CREATININE 1.02 06/17/2012 1130    CALCIUM 10.0 04/02/2014 1232   PROT 7.2 04/02/2014 1232   ALBUMIN 4.5 04/02/2014 1232   AST 19 04/02/2014 1232   ALT 27 04/02/2014 1232   ALKPHOS 85 04/02/2014 1232   BILITOT 0.7 04/02/2014 1232   GFRNONAA 48* 04/02/2014 1232   GFRNONAA 76* 06/17/2012 1130   GFRAA 56* 04/02/2014 1232   GFRAA 88* 06/17/2012 1130    Lab Results  Component Value Date/Time   CHOL 172 12/28/2013 09:34 AM    Lab Results  Component Value Date/Time   HGBA1C 6.80 07/23/2014 11:13 AM   HGBA1C 6.0* 06/17/2012 11:30 AM    Lab Results  Component Value Date/Time   AST 19 04/02/2014 12:32 PM    Assessment and Plan  Other specified diabetes mellitus without complications - Plan:  Results for orders placed or performed in visit on 07/23/14  Glucose (CBG)  Result Value Ref Range   POC Glucose 208.0 (A) 70 - 99 mg/dl  HgB B1Y  Result Value Ref Range   Hemoglobin A1C 6.80    Hemoglobin A1c has trended down, continue with current meds, glimepiride (AMARYL) 2 MG tablet, metformin, will repeat A1c on the following visit.  Other specified hypothyroidism - Plan: Currently patient is on levothyroxine (SYNTHROID) 50 MCG tablet, will repeatTSH  Essential hypertension - Plan: blood pressure is controlled continue with current meds valsartan-hydrochlorothiazide (DIOVAN HCT) 160-12.5 MG per tablet, metoprolol succinate (TOPROL-XL) 50 MG 24 hr tablet, COMPLETE METABOLIC PANEL WITH GFR  Boil - Plan: sulfamethoxazole-trimethoprim (BACTRIM DS,SEPTRA DS) 800-160 MG per tablet   Gastroesophageal reflux disease, esophagitis presence not specified - Plan: last modification, continue omeprazole (PRILOSEC) 20 MG capsule     Return in about 3 months (around 10/22/2014), or if symptoms worsen or fail to improve.   This note has been created with Education officer, environmental. Any transcriptional errors are unintentional.    Doris Cheadle, MD

## 2014-07-23 NOTE — Patient Instructions (Signed)
Flexibility: Corner Stretch   Standing in corner with hands just above shoulder level and feet ____ inches from corner, lean forward until a comfortable stretch is felt across chest. Hold _30___ seconds. Repeat __3__ times per set. Do _2___ sets per session. Do _2___ sessions per day.  http://orth.exer.us/342   Copyright  VHI. All rights reserved.

## 2014-07-24 ENCOUNTER — Ambulatory Visit
Admission: RE | Admit: 2014-07-24 | Discharge: 2014-07-24 | Disposition: A | Discharge status: Home / Self Care | Payer: Medicare Other | Source: Ambulatory Visit | Attending: Sports Medicine | Admitting: Sports Medicine

## 2014-07-24 DIAGNOSIS — M4317 Spondylolisthesis, lumbosacral region: Secondary | ICD-10-CM

## 2014-07-26 ENCOUNTER — Ambulatory Visit: Payer: Medicare Other | Attending: Sports Medicine | Admitting: Physical Therapy

## 2014-07-26 DIAGNOSIS — M549 Dorsalgia, unspecified: Secondary | ICD-10-CM | POA: Diagnosis not present

## 2014-07-26 DIAGNOSIS — R29898 Other symptoms and signs involving the musculoskeletal system: Secondary | ICD-10-CM | POA: Diagnosis not present

## 2014-07-26 DIAGNOSIS — M542 Cervicalgia: Secondary | ICD-10-CM | POA: Diagnosis not present

## 2014-07-26 DIAGNOSIS — G8929 Other chronic pain: Secondary | ICD-10-CM

## 2014-07-26 NOTE — Therapy (Signed)
[facility] [address] [place], [place], [address] Phone: [phone]   Fax:  [phone]  Physical Therapy Treatment  Patient Details  Name: Jose Brady MRN: [id number] Date of Birth: [birth year] Referring Provider:  Doris Cheadle, MD  Encounter Date: 07/26/2014      PT End of Session - 07/26/14 1315    Visit Number 4   Number of Visits 16   Date for PT Re-Evaluation 08/24/14   PT Start Time 1059   PT Stop Time 1155   PT Time Calculation (min) 56 min   Activity Tolerance Patient tolerated treatment well;No increased pain   Behavior During Therapy [facility] for tasks assessed/performed      Past Medical History  Diagnosis Date  . Hypertension   . Diabetes mellitus without complication   . Thyroid disease     No past surgical history on file.  There were no vitals filed for this visit.  Visit Diagnosis:  Shoulder weakness  Cervical pain (neck)  Painful cervical ROM  Chronic neck and back pain      Subjective Assessment - 07/26/14 1310    Subjective Not in pain today, continues to c/o inability to read for longer than 5 minutes due to increase in neck pain. Says that traction helps him feel better. Performs self-traction at home by hanging his head down from the bed, states that it helps his pain.    Currently in Pain? No/denies         [facility] Adult PT Treatment/Exercise - 07/26/14 1116    Neck Exercises: Machines for Strengthening   UBE (Upper Arm Bike) 5 minutes, level 2   Neck Exercises: Theraband   Scapula Retraction 15 reps   Shoulder Extension 15 reps;Green   Rows 15 reps;Green   Shoulder External Rotation 10 reps;Green   Horizontal ABduction 15 reps;Green   Neck Exercises: Standing   Neck Retraction 10 reps;5 secs   Traction   Type of Traction Cervical   Min (lbs) 8   Max (lbs) 16   Hold Time 60   Rest Time 15   Time 15     Cervical retraction along with shoulder exercises for  strengthening of DNFs.      PT Education - 07/26/14 1314    Education provided Yes   Education Details Home traction device, gave refferal for MD to sign   Person(s) Educated Patient   Methods Explanation;Handout   Comprehension Verbalized understanding          PT Short Term Goals - 07/23/14 1020    PT SHORT TERM GOAL #1   Title Pt will be I with HEP   Status On-going   PT SHORT TERM GOAL #2   Title Pt will improve cervical side bending to 40 degr with min pain for safe driving    Status On-going   PT SHORT TERM GOAL #3   Title Pt will demo correct posture while in the clinic with minimal cues    Status On-going   PT SHORT TERM GOAL #4   Title Pt's shoulder flexion strength will improve to 4+/5 in order to be able to lift and carru abjects around 5 lbs weight for 10-15 minutes   Status On-going           PT Long Term Goals - 07/23/14 1024    PT LONG TERM GOAL #1   Title Pt will be I with advanced HEP   Status On-going   PT LONG TERM GOAL #2  Title Pt will demo and report proper posture and body mechanics at home and at work without cues   Status On-going   PT LONG TERM GOAL #3   Title Pt will have improved shoulder and forearm strength 5/5 throughout to promote safe lifting and carrying of objects >10lbs for up to 20 minutes   Status On-going   PT LONG TERM GOAL #4   Title Pt will improve cervical R rotation to 12 cm in order to drive safely and without pain    Status Achieved   PT LONG TERM GOAL #5   Title Pt will be able to read in sitting for longer than 15 minutes without increase in pain    Baseline after 5 minutes pain increases currently   Time 8   Period Weeks   Status New          Plan - 07/26/14 1316    Clinical Impression Statement Peformed stabilizing and strengthening execises, neck stretches, and mechanical traction  today. Pt tolerated well feeling better after traetment. Educated on potention purchase of cervical home traction device, gave  referral for MD to sign, Mr. Allena Katzatel stated that he will see his MD tomorrow and will discuss home unit for traction.      PT Next Visit Plan Continue neck stretches and strengthening, shoulder strengthening (rows, extensions, flexion, hor abduction) review.  Mechanical traction. Update goals.   PT Home Exercise Plan as previous   Consulted and Agree with Plan of Care Patient        Problem List Patient Active Problem List   Diagnosis Date Noted  . Spondylolisthesis at L5-S1 level 06/23/2014  . 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 Right hip pain 06/23/2014  . Neck pain 06/23/2014  . Diabetes mellitus due to underlying condition without complications 10/09/2013  . Gastroesophageal reflux disease without esophagitis 10/09/2013  . Hypertriglyceridemia 10/09/2013  . Back muscle spasm 10/09/2013  . Hypertension 06/17/2012  . Type 2 diabetes mellitus 06/17/2012  . Dyslipidemia 06/17/2012  . Hypothyroidism 06/17/2012  . Vitamin D insufficiency 06/17/2012    Durward MallardAna Kasaundra Fahrney 07/26/2014, 1:22 PM  [facility] [address] [place], [place], [address] Phone: [phone]   Fax:  [phone]

## 2014-07-27 ENCOUNTER — Telehealth: Payer: Self-pay

## 2014-07-27 ENCOUNTER — Ambulatory Visit (INDEPENDENT_AMBULATORY_CARE_PROVIDER_SITE_OTHER): Payer: Medicare Other | Admitting: Sports Medicine

## 2014-07-27 ENCOUNTER — Encounter: Payer: Self-pay | Admitting: Sports Medicine

## 2014-07-27 VITALS — BP 127/78 | Ht 63.0 in | Wt 160.0 lb

## 2014-07-27 DIAGNOSIS — M25562 Pain in left knee: Secondary | ICD-10-CM

## 2014-07-27 MED ORDER — METHYLPREDNISOLONE ACETATE 40 MG/ML IJ SUSP
40.0000 mg | Freq: Once | INTRAMUSCULAR | Status: AC
Start: 2014-07-27 — End: 2014-07-27
  Administered 2014-07-27: 40 mg via INTRA_ARTICULAR

## 2014-07-27 NOTE — Telephone Encounter (Signed)
Patient not available Left message with family member to have him return our call 

## 2014-07-27 NOTE — Telephone Encounter (Signed)
-----   Message from Dessa PhiJosalyn Funches, MD sent at 07/26/2014  2:19 PM EDT ----- Normal TSH Sodium a bit low on CMP, is patient taking benicar-HCT or diovan-HCT ?, both on med list, Recommend ARB only with removal of HCT component, will defer to PCP.

## 2014-07-27 NOTE — Progress Notes (Signed)
   Subjective:    Patient ID: Jose Brady, male    DOB: 07/17/1950, 64 y.o.   MRN: [id number]  HPI   Patient comes in today to discuss MRI findings of his lumbar spine as well as to discuss chronic intermittent left knee pain. In regards to the MRI of his lumbar spine he has chronic bilateral pars defects at L5 with advanced L5-S1 degenerative disc disease. Back pain is currently tolerable but his left knee pain has returned. He has had x-rays in the past which suggest loose bodies in the knee. He has had several cortisone injections in the past and needs provide him with a positive get limited. Of symptom relief. In addition the pain he does get locking of the knee as well as intermittent swelling. His pain is diffuse throughout the knee. No associated numbness or tingling. It improves at rest.    Review of Systems     Objective:   Physical Exam Well-developed, well-nourished. No acute distress.  Lumbar spine: Fairly good lumbar range of motion with some pain with extension. No tenderness to palpation along the lumbar midline or paraspinal musculature. No spasm. No focal neurological deficit of either lower extremity.  Left knee: Limited range of motion secondary to pain. No obvious effusion. Diffuse tenderness to palpation along medial lateral joint lines. Good ligamentous stability. Neurovascularly intact distally. Walking with a limp.  X-rays of the left knee are as above. There are 2 sets of x-rays 1 from 2013 1 from 2014. Most recent films show only some mild medial compartmental narrowing with what appears to be several intra-articular loose bodies. Nothing acute.       Assessment & Plan:  Chronic left knee pain with x-ray evidence of probable interarticular loose bodies Chronic low back pain secondary to L5 pars defects with advanced L5-S1 degenerative disc disease  I decided to reinject the patient's left knee with cortisone today. I would also like to get an MRI  primarily for preoperative planning. If loose bodies are confirmed and we will discuss referral to orthopedics at his follow-up visit. His low back pain is a chronic condition secondary to his advanced L5-S1 degenerative disc disease. I recommended continued conservative treatment with physical therapy and intermittent pain medications. I've asked the patient to follow-up with me after the MRI of his knee so that we may review those results and delineate further treatment.  Consent obtained and verified. Time-out conducted. Noted no overlying erythema, induration, or other signs of local infection. Skin prepped in a sterile fashion. Topical analgesic spray: Ethyl chloride. Joint: left knee Needle: 22g 1.5 inch Completed without difficulty. Meds: 3cc 1% xylocaine, 1cc (40mg ) depomedrol  Advised to call if fevers/chills, erythema, induration, drainage, or persistent bleeding.

## 2014-07-29 ENCOUNTER — Ambulatory Visit: Payer: Medicare Other | Admitting: Physical Therapy

## 2014-07-29 DIAGNOSIS — G8929 Other chronic pain: Secondary | ICD-10-CM

## 2014-07-29 DIAGNOSIS — R29898 Other symptoms and signs involving the musculoskeletal system: Secondary | ICD-10-CM

## 2014-07-29 DIAGNOSIS — M549 Dorsalgia, unspecified: Secondary | ICD-10-CM

## 2014-07-29 DIAGNOSIS — M542 Cervicalgia: Secondary | ICD-10-CM | POA: Diagnosis not present

## 2014-07-29 NOTE — Patient Instructions (Addendum)
Over Head Pull: Narrow Grip       On back, knees bent, feet flat, band across thighs, elbows straight but relaxed. Pull hands apart (start). Keeping elbows straight, bring arms up and over head, hands toward floor. Keep pull steady on band. Hold momentarily. Return slowly, keeping pull steady, back to start. Repeat ___ times. Band color ______   Side Pull: Double Arm   On back, knees bent, feet flat. Arms perpendicular to body, shoulder level, elbows straight but relaxed. Pull arms out to sides, elbows straight. Resistance band comes across collarbones, hands toward floor. Hold momentarily. Slowly return to starting position. Repeat ___ times. Band color _____   Sash   On back, knees bent, feet flat, left hand on left hip, right hand above left. Pull right arm DIAGONALLY (hip to shoulder) across chest. Bring right arm along head toward floor. Hold momentarily. Slowly return to starting position. Repeat ___ times. Do with left arm. Band color ______   Shoulder Rotation: Double Arm   On back, knees bent, feet flat, elbows tucked at sides, bent 90, hands palms up. Pull hands apart and down toward floor, keeping elbows near sides. Hold momentarily. Slowly return to starting position. Repeat ___ times. Band color ______  10-20 reps, green band all

## 2014-07-29 NOTE — Therapy (Addendum)
[place], [place], [address] Phone: [phone]   Fax:  [phone]  Physical Therapy Treatment  Patient Details  Name: Encarnacion Scioneaux MRN: [id number] Date of Birth: 1951-02-21 Referring Provider:  Lorayne Marek, MD  Encounter Date: 07/29/2014      PT End of Session - 07/29/14 1054    Visit Number 5   Number of Visits 16   Date for PT Re-Evaluation 08/24/14   PT Start Time 1016   PT Stop Time 1110   PT Time Calculation (min) 54 min   Activity Tolerance Patient tolerated treatment well      Past Medical History  Diagnosis Date  . Hypertension   . Diabetes mellitus without complication   . Thyroid disease     No past surgical history on file.  There were no vitals filed for this visit.  Visit Diagnosis:  Shoulder weakness  Cervical pain (neck)  Chronic neck and back pain      Subjective Assessment - 07/29/14 1032    Subjective Traction script. Pain with reading, reads in bed   Currently in Pain? No/denies   Pain Score --  6-7/10   Pain Orientation Right;Posterior   Pain Descriptors / Indicators Aching   Pain Type Chronic pain   Pain Radiating Towards Upper trap, back of head   Pain Frequency Intermittent   Aggravating Factors  reading   Pain Relieving Factors heat, not reading   Multiple Pain Sites No                         [facility] Adult PT Treatment/Exercise - 07/29/14 1036    Neck Exercises: Seated   Other Seated Exercise deep neck flexor  strengthening.  deep dtretch noted along spine. 8 reps 5 second holds.   Neck Exercises: Supine   Other Supine Exercise scapular band series for stabilization, green band , 10 reps   Traction   Type of Traction Cervical   Min (lbs) 8   Max (lbs) 16   Hold Time 60   Rest Time 15   Time 16  2 ramps up/down                PT Education - 07/29/14 1053    Education provided Yes   Education Details  scap stab exercises,  Options to reading in bed,  tablet stand, chair reADing with arm support.   Person(s) Educated Patient   Methods Explanation;Demonstration;Handout   Comprehension Verbalized understanding;Returned demonstration          PT Short Term Goals - 07/29/14 1346    PT SHORT TERM GOAL #1   Title Pt will be I with HEP   Status Achieved   PT SHORT TERM GOAL #2   Title Pt will improve cervical side bending to 40 degr with min pain for safe driving    Baseline 55 RT 45 LT   Time 4   Period Weeks   Status Achieved   PT SHORT TERM GOAL #3   Title Pt will demo correct posture while in the clinic with minimal cues    Status Achieved   PT SHORT TERM GOAL #4   Baseline mmt 4+/5 bilateral   Time 4   Period Weeks   Status Achieved           PT Long Term Goals - 07/29/14 1347    PT LONG TERM GOAL #1   Title Pt will be I  with advanced HEP   Time 8   Period Weeks   Status On-going   PT LONG TERM GOAL #2   Title Pt will demo and report proper posture and body mechanics at home and at work without cues   Time 8   Period Weeks   Status On-going   PT LONG TERM GOAL #3   Title Pt will have improved shoulder and forearm strength 5/5 throughout to promote safe lifting and carrying of objects >10lbs for up to 20 minutes   Time 8   Period Weeks   Status On-going   PT LONG TERM GOAL #4   Title Pt will improve cervical R rotation to 12 cm in order to drive safely and without pain    Time 8   Period Weeks   Status Unable to assess   PT LONG TERM GOAL #5   Title Pt will be able to read in sitting for longer than 15 minutes without increase in pain    Time 8   Period Weeks   Status On-going               Plan - 07/29/14 1055    Clinical Impression Statement review stabilizatiob ex, Patient to call medicare, PT to send info to [place] and have them contact patient with what they find out.  progress toward home exercise goal   PT Next Visit Plan see  above        Problem List Patient Active Problem List   Diagnosis Date Noted  . Spondylolisthesis at L5-S1 level 06/23/2014  . 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 Right hip pain 06/23/2014  . Neck pain 06/23/2014  . Diabetes mellitus due to underlying condition without complications 65/78/4696  . Gastroesophageal reflux disease without esophagitis 10/09/2013  . Hypertriglyceridemia 10/09/2013  . Back muscle spasm 10/09/2013  . Hypertension 06/17/2012  . Type 2 diabetes mellitus 06/17/2012  . Dyslipidemia 06/17/2012  . Hypothyroidism 06/17/2012  . Vitamin D insufficiency 06/17/2012    Rhyker Silversmith 07/29/2014, 1:51 PM  [facility] [address] [place], [place], [address] Phone: [phone]   Fax:  [phone]  Melvenia Needles, PTA 07/29/2014 1:51 PM Phone: [phone] Fax: [phone]    PHYSICAL THERAPY DISCHARGE SUMMARY  Visits from Start of Care: 5  Current functional level related to goals / functional outcomes: Unknown as pt did not return   Remaining deficits: Unknown    Education / Equipment: Posture, HEP, home traction for C -spine Plan: Patient agrees to discharge.  Patient goals were partially met. Patient is being discharged due to not returning since the last visit.  Raeford Razor, PT 02/09/2015 10:10 PM Phone: [phone] Fax: [phone]   ?????

## 2014-08-04 ENCOUNTER — Ambulatory Visit
Admission: RE | Admit: 2014-08-04 | Discharge: 2014-08-04 | Disposition: A | Discharge status: Home / Self Care | Payer: Medicare Other | Source: Ambulatory Visit | Attending: Sports Medicine | Admitting: Sports Medicine

## 2014-08-04 DIAGNOSIS — M25562 Pain in left knee: Secondary | ICD-10-CM

## 2014-08-09 ENCOUNTER — Ambulatory Visit (INDEPENDENT_AMBULATORY_CARE_PROVIDER_SITE_OTHER): Payer: Medicare Other | Admitting: Sports Medicine

## 2014-08-09 ENCOUNTER — Encounter: Payer: Self-pay | Admitting: Sports Medicine

## 2014-08-09 VITALS — BP 114/75 | Ht 63.0 in | Wt 160.0 lb

## 2014-08-09 DIAGNOSIS — M23207 Derangement of unspecified meniscus due to old tear or injury, left knee: Secondary | ICD-10-CM

## 2014-08-09 DIAGNOSIS — M23204 Derangement of unspecified medial meniscus due to old tear or injury, left knee: Secondary | ICD-10-CM

## 2014-08-09 DIAGNOSIS — M23201 Derangement of unspecified lateral meniscus due to old tear or injury, left knee: Secondary | ICD-10-CM

## 2014-08-09 NOTE — Progress Notes (Deleted)
   Subjective:    Patient ID: Jose Brady, male    DOB: 12/26/1950, 64 y.o.   MRN: [id number]  HPI     Review of Systems     Objective:   Physical Exam        Assessment & Plan:

## 2014-08-09 NOTE — Progress Notes (Signed)
Patient ID: Jose Brady, male   DOB: January 19, 1951, 64 y.o.   MRN: [id number]  Patient comes in today to discuss MRI findings of his left knee. He has a large radial tear of the posterior horn of the medial meniscus as well as several free osteochondral fragments. 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 Based on these findings as well as his failure to improve with conservative treatment to date I have recommended referral to orthopedics to discuss arthroscopy for loose body removal and meniscectomy. His son will be leaving soon for a trip to [place] and will be gone for 2 months so the patient would like to wait until his son returns before proceeding with surgery. Therefore, I recommend that both the patient and his son follow-up with me upon his son's return at which point we can further discuss referral to orthopedics. In the meantime, patient would like to try a stabilizer brace. We will fit him with a double upright brace.  Total time spent with the patient was 10 minutes in face-to-face consultation with 100% of the time spent discussing MRI findings and further treatment.

## 2014-08-10 NOTE — Addendum Note (Signed)
Addended by: Annita BrodMOORE, Surabhi Gadea C on: 08/10/2014 03:43 PM   Modules accepted: Orders

## 2014-08-10 NOTE — Patient Instructions (Signed)
Dr Thurston HoleWainer Tuesday 08/17/14 @ 830am [address] [place] [id number] [phone]

## 2014-12-14 ENCOUNTER — Ambulatory Visit: Payer: Medicare Other | Attending: Internal Medicine | Admitting: Internal Medicine

## 2014-12-14 ENCOUNTER — Encounter: Payer: Self-pay | Admitting: Internal Medicine

## 2014-12-14 VITALS — BP 103/71 | HR 101 | Temp 97.7°F | Resp 16 | Ht 63.0 in | Wt 162.2 lb

## 2014-12-14 DIAGNOSIS — K219 Gastro-esophageal reflux disease without esophagitis: Secondary | ICD-10-CM | POA: Insufficient documentation

## 2014-12-14 DIAGNOSIS — Z23 Encounter for immunization: Secondary | ICD-10-CM | POA: Diagnosis not present

## 2014-12-14 DIAGNOSIS — E781 Pure hyperglyceridemia: Secondary | ICD-10-CM | POA: Insufficient documentation

## 2014-12-14 DIAGNOSIS — E079 Disorder of thyroid, unspecified: Secondary | ICD-10-CM | POA: Insufficient documentation

## 2014-12-14 DIAGNOSIS — E119 Type 2 diabetes mellitus without complications: Secondary | ICD-10-CM | POA: Insufficient documentation

## 2014-12-14 DIAGNOSIS — M25551 Pain in right hip: Secondary | ICD-10-CM | POA: Insufficient documentation

## 2014-12-14 DIAGNOSIS — I10 Essential (primary) hypertension: Secondary | ICD-10-CM | POA: Diagnosis not present

## 2014-12-14 LAB — GLUCOSE, POCT (MANUAL RESULT ENTRY): POC GLUCOSE: 157 mg/dL — AB (ref 70–99)

## 2014-12-14 LAB — POCT GLYCOSYLATED HEMOGLOBIN (HGB A1C): Hemoglobin A1C: 6.8

## 2014-12-14 MED ORDER — GLIMEPIRIDE 2 MG PO TABS: 2.0000 mg | ORAL_TABLET | Freq: Two times a day (BID) | ORAL | Status: DC

## 2014-12-14 MED ORDER — OMEGA-3-ACID ETHYL ESTERS 1 G PO CAPS: 2.0000 g | ORAL_CAPSULE | Freq: Two times a day (BID) | ORAL | Status: DC

## 2014-12-14 MED ORDER — OLMESARTAN MEDOXOMIL-HCTZ 40-12.5 MG PO TABS: 1.0000 | ORAL_TABLET | Freq: Every day | ORAL | Status: DC

## 2014-12-14 MED ORDER — GABAPENTIN 300 MG PO CAPS: 300.0000 mg | ORAL_CAPSULE | Freq: Every day | ORAL | Status: DC

## 2014-12-14 MED ORDER — METOPROLOL SUCCINATE ER 50 MG PO TB24: 50.0000 mg | ORAL_TABLET | Freq: Every day | ORAL | Status: DC

## 2014-12-14 MED ORDER — OMEPRAZOLE 20 MG PO CPDR: 20.0000 mg | DELAYED_RELEASE_CAPSULE | Freq: Two times a day (BID) | ORAL | Status: DC

## 2014-12-14 MED ORDER — METFORMIN HCL ER 500 MG PO TB24: 500.0000 mg | ORAL_TABLET | Freq: Two times a day (BID) | ORAL | Status: DC

## 2014-12-14 NOTE — Patient Instructions (Signed)
Gastroenterology will call you for a appointment. They will evaluate your acid reflux  The eye doctor will call you soon for a eye appointment as well.

## 2014-12-14 NOTE — Progress Notes (Signed)
Patient here for follow up on his diabetes and thyroid disease Patient complains of having acid reflux and currently using tums Patient also requesting refills on his medicaitons

## 2014-12-14 NOTE — Progress Notes (Signed)
Patient ID: Jose Brady, male   DOB: 09-Apr-1950, 64 y.o.   MRN: [id number]  CC: follow up  HPI: Breyer Tejera is a 64 y.o. male here today for a follow up visit.  Patient has past medical history of HTN, T2DM, and thyroid disease. Patient reports that he has been taking all of his medication without complications. He checks his blood sugars once every few days with levels usually less than 120. He denies symptoms of blurred vision, urinary frequency, neuropathy. He reports that his medication was switched to Benicar-HCTZ from Diovan due to cost of medication.   Patient is concerned about severe acid reflux. He reports that he has tried Nexium and Omeprazole in the past without significant relief. Patient reports that everything he eats feels like it get stuck in his throat and epigastric region. He has had surgical removal of gallbladder 20 years ago. Patient has never been evaluated by a GI doctor.   Allergies  Allergen Reactions  . Penicillins Hives   Past Medical History  Diagnosis Date  . Hypertension   . Diabetes mellitus without complication   . Thyroid disease    Current Outpatient Prescriptions on File Prior to Visit  Medication Sig Dispense Refill  . diclofenac (VOLTAREN) 75 MG EC tablet Take one tab BID PRN 180 tablet 2  . esomeprazole (NEXIUM) 40 MG packet Take 40 mg by mouth daily before breakfast. 30 each 12  . hydrocortisone cream 1 % Apply 1 application topically 2 (two) times daily. 30 g 1  . levothyroxine (SYNTHROID) 50 MCG tablet Take 1 tablet (50 mcg total) by mouth daily before breakfast. 90 tablet 2  . traMADol (ULTRAM) 50 MG tablet Take 2 tablets daily as needed for pain (Patient not taking: Reported on 12/14/2014) 60 tablet 0  . [DISCONTINUED] irbesartan (AVAPRO) 300 MG tablet Take 1 tablet (300 mg total) by mouth at bedtime. 30 tablet 5   No current facility-administered medications on file prior to visit.   Family History  Problem Relation Age of  Onset  . Hypertension Mother   . Hypertension Father    Social History   Social History  . Marital Status: Married    Spouse Name: N/A  . Number of Children: N/A  . Years of Education: N/A   Occupational History  . Not on file.   Social History Main Topics  . Smoking status: Never Smoker   . Smokeless tobacco: Not on file  . Alcohol Use: Not on file  . Drug Use: Not on file  . Sexual Activity: Not on file   Other Topics Concern  . Not on file   Social History Narrative    Review of Systems: Other than what is stated in HPI, all other systems are negative.   Objective:   Filed Vitals:   12/14/14 1622  BP: 103/71  Pulse: 101  Temp: 97.7 F (36.5 C)  Resp: 16    Physical Exam  Constitutional: He is oriented to person, place, and time.  Cardiovascular: Normal rate, regular rhythm and normal heart sounds.   Pulses:      Dorsalis pedis pulses are 2+ on the right side, and 2+ on the left side.       Posterior tibial pulses are 2+ on the right side, and 2+ on the left side.  Pulmonary/Chest: Effort normal and breath sounds normal.  Feet:  Right Foot:  Skin Integrity: Negative for skin breakdown.  Left Foot:  Skin Integrity: Negative for skin breakdown.  Neurological:  He is alert and oriented to person, place, and time.  Skin: Skin is warm and dry.  Psychiatric: He has a normal mood and affect.     Lab Results  Component Value Date   WBC 5.6 07/10/2013   HGB 14.5 07/10/2013   HCT 41.3 07/10/2013   MCV 85.3 07/10/2013   PLT 246 07/10/2013   Lab Results  Component Value Date   CREATININE 1.31 07/23/2014   BUN 23 07/23/2014   NA 134* 07/23/2014   K 4.5 07/23/2014   CL 100 07/23/2014   CO2 22 07/23/2014    Lab Results  Component Value Date   HGBA1C 6.80 12/14/2014   Lipid Panel     Component Value Date/Time   CHOL 172 12/28/2013 0934   TRIG 401* 12/28/2013 0934   HDL 32* 12/28/2013 0934   CHOLHDL 5.4 12/28/2013 0934   VLDL NOT CALC  12/28/2013 0934   LDLCALC NOT CALC 12/28/2013 0934       Assessment and plan:   Roan was seen today for follow-up.  Diagnoses and all orders for this visit:  Type 2 diabetes mellitus without complication -     Glucose (CBG) -     HgB A1c -     metFORMIN (GLUCOPHAGE XR) 500 MG 24 hr tablet; Take 1 tablet (500 mg total) by mouth 2 (two) times daily with a meal. -     glimepiride (AMARYL) 2 MG tablet; Take 1 tablet (2 mg total) by mouth 2 (two) times daily. -     Ambulatory referral to Ophthalmology -     Microalbumin, urine -     Basic Metabolic Panel Patients diabetes is well control as evidence by consistently low a1c.  Patient will continue with current therapy and continue to make necessary lifestyle changes.  Reviewed foot care, diet, exercise, annual health maintenance with patient.   Essential hypertension -     metoprolol succinate (TOPROL-XL) 50 MG 24 hr tablet; Take 1 tablet (50 mg total) by mouth daily. Take with or immediately following a meal. -     olmesartan-hydrochlorothiazide (BENICAR HCT) 40-12.5 MG per tablet; Take 1 tablet by mouth daily. -     Basic Metabolic Panel; Future Patient blood pressure is stable and may continue on current medication.  Education on diet, exercise, and modifiable risk factors discussed. Will obtain appropriate labs as needed. Will follow up in 3-6 months.   Hypertriglyceridemia -     omega-3 acid ethyl esters (LOVAZA) 1 G capsule; Take 2 capsules (2 g total) by mouth 2 (two) times daily. -     Lipid panel; Future I will recheck lipid tomorrow. Patient will continue lovaza for now. Diet changes and exercise discussed.   Gastroesophageal reflux disease, esophagitis presence not specified -     Ambulatory referral to Gastroenterology -     omeprazole (PRILOSEC) 20 MG capsule; Take 1 capsule (20 mg total) by mouth 2 (two) times daily before a meal. Discussed diet and weight with patient relating to acid reflux.  Went over things that  may exacerbate acid reflux such as tomatoes, spicy foods, coffee, carbonated beverages, chocolates, etc.  Advised patient to avoid laying down at least two hours after meals and sleep with HOB elevated.   Right hip pain -     gabapentin (NEURONTIN) 300 MG capsule; Take 1 capsule (300 mg total) by mouth at bedtime.  Need for influenza vaccination -     Flu Vaccine QUAD 36+ mos IM  Return in about  1 day (around 12/15/2014) for Lab Visit and 3 mo PCP .       Ambrose Finland, NP-C [facility] [phone] 12/14/2014, 10:09 PM

## 2014-12-15 ENCOUNTER — Ambulatory Visit: Payer: Medicare Other | Attending: Internal Medicine

## 2014-12-15 DIAGNOSIS — E781 Pure hyperglyceridemia: Secondary | ICD-10-CM

## 2014-12-15 DIAGNOSIS — I10 Essential (primary) hypertension: Secondary | ICD-10-CM

## 2014-12-15 LAB — LIPID PANEL
CHOLESTEROL: 194 mg/dL (ref 125–200)
HDL: 32 mg/dL — ABNORMAL LOW (ref >=40)
LDL Cholesterol: 87 mg/dL (ref <130)
Total CHOL/HDL Ratio: 6.1 Ratio — ABNORMAL HIGH (ref <=5.0)
Triglycerides: 373 mg/dL — ABNORMAL HIGH (ref <150)
VLDL: 75 mg/dL — ABNORMAL HIGH (ref <30)

## 2014-12-15 LAB — BASIC METABOLIC PANEL
BUN: 20 mg/dL (ref 7–25)
CHLORIDE: 100 mmol/L (ref 98–110)
CO2: 23 mmol/L (ref 20–31)
Calcium: 9 mg/dL (ref 8.6–10.3)
Creat: 1.41 mg/dL — ABNORMAL HIGH (ref 0.70–1.25)
GLUCOSE: 123 mg/dL — AB (ref 65–99)
Potassium: 4.7 mmol/L (ref 3.5–5.3)
Sodium: 132 mmol/L — ABNORMAL LOW (ref 135–146)

## 2014-12-15 LAB — MICROALBUMIN, URINE: MICROALB UR: 0.5 mg/dL (ref <2.0)

## 2014-12-20 ENCOUNTER — Other Ambulatory Visit: Payer: Self-pay | Admitting: Internal Medicine

## 2014-12-20 ENCOUNTER — Telehealth: Payer: Self-pay

## 2014-12-20 DIAGNOSIS — E781 Pure hyperglyceridemia: Secondary | ICD-10-CM

## 2014-12-20 DIAGNOSIS — E119 Type 2 diabetes mellitus without complications: Secondary | ICD-10-CM

## 2014-12-20 MED ORDER — PIOGLITAZONE HCL 15 MG PO TABS: 15.0000 mg | ORAL_TABLET | Freq: Every day | ORAL | Status: DC

## 2014-12-20 MED ORDER — GEMFIBROZIL 600 MG PO TABS: 600.0000 mg | ORAL_TABLET | Freq: Two times a day (BID) | ORAL | Status: DC

## 2014-12-20 NOTE — Telephone Encounter (Signed)
-----   Message from Ambrose Finland, NP sent at 12/20/2014  1:06 PM EDT ----- I would like to take the patient off Lovaza and switch him to Gemfibrozil. Please go over alcohol, carbohydrates, fried foods, sweets that increase cholesterol. I have already placed order for medication

## 2014-12-20 NOTE — Telephone Encounter (Signed)
Attempted to contact patient Patient not available Left message on voice mail to return our call 

## 2014-12-23 ENCOUNTER — Telehealth: Payer: Self-pay | Admitting: Internal Medicine

## 2014-12-23 ENCOUNTER — Telehealth: Payer: Self-pay

## 2014-12-23 NOTE — Telephone Encounter (Signed)
Patient would like lab results. °Please follow up. ° °

## 2014-12-23 NOTE — Telephone Encounter (Signed)
Spoke with patient this afternoon and he is aware of his lab results  Patient is also aware to stop the lovaza and start with gemfibrozil Patient also inquired about his referrals so call was transferred to the referral coordinator

## 2015-01-03 ENCOUNTER — Telehealth: Payer: Self-pay

## 2015-01-03 NOTE — Telephone Encounter (Signed)
Returned patient phone call Patient not available Left message on voice mail to return our call 

## 2015-01-11 LAB — HM DIABETES EYE EXAM

## 2015-01-12 ENCOUNTER — Encounter: Payer: Self-pay | Admitting: Internal Medicine

## 2015-01-12 ENCOUNTER — Telehealth: Payer: Self-pay | Admitting: Internal Medicine

## 2015-01-12 ENCOUNTER — Ambulatory Visit: Payer: Medicare Other | Attending: Internal Medicine | Admitting: Internal Medicine

## 2015-01-12 VITALS — BP 129/87 | HR 76 | Temp 98.0°F | Resp 16 | Ht 63.0 in | Wt 160.8 lb

## 2015-01-12 DIAGNOSIS — R319 Hematuria, unspecified: Secondary | ICD-10-CM

## 2015-01-12 DIAGNOSIS — E119 Type 2 diabetes mellitus without complications: Secondary | ICD-10-CM

## 2015-01-12 DIAGNOSIS — E781 Pure hyperglyceridemia: Secondary | ICD-10-CM | POA: Diagnosis not present

## 2015-01-12 LAB — POCT URINALYSIS DIPSTICK
GLUCOSE UA: 100
LEUKOCYTES UA: NEGATIVE
Nitrite, UA: NEGATIVE
Protein, UA: 30
Spec Grav, UA: >=1.03
Urobilinogen, UA: 0.2
pH, UA: 5

## 2015-01-12 NOTE — Progress Notes (Signed)
Patient ID: Jose Brady, male   DOB: May 06, 1950, 64 y.o.   MRN: [id number]  Patient left before being evaluated.

## 2015-01-12 NOTE — Progress Notes (Signed)
Patient left before being seen by provider  Patient arrived early and felt he would be seen early And had to leave to be at work at the same time of his appointment

## 2015-01-12 NOTE — Progress Notes (Signed)
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

## 2015-01-12 NOTE — Telephone Encounter (Signed)
Patient presents stating that both medications that he is currently taking (Gemfibrozil 600mg , Pioglitazone 15mg ) is causing him extreme dizziness that interferes with his ability to work as well as constipation and gas. Please f/u with pt ASAP to discuss other medication options.

## 2015-01-14 ENCOUNTER — Ambulatory Visit: Payer: Medicare Other | Attending: Internal Medicine | Admitting: Internal Medicine

## 2015-01-14 ENCOUNTER — Encounter: Payer: Self-pay | Admitting: Internal Medicine

## 2015-01-14 VITALS — BP 138/84 | HR 92 | Temp 98.0°F | Resp 16 | Ht 63.0 in | Wt 160.0 lb

## 2015-01-14 DIAGNOSIS — B369 Superficial mycosis, unspecified: Secondary | ICD-10-CM

## 2015-01-14 DIAGNOSIS — T887XXA Unspecified adverse effect of drug or medicament, initial encounter: Secondary | ICD-10-CM

## 2015-01-14 DIAGNOSIS — H34211 Partial retinal artery occlusion, right eye: Secondary | ICD-10-CM | POA: Diagnosis not present

## 2015-01-14 DIAGNOSIS — T50905A Adverse effect of unspecified drugs, medicaments and biological substances, initial encounter: Secondary | ICD-10-CM

## 2015-01-14 DIAGNOSIS — R319 Hematuria, unspecified: Secondary | ICD-10-CM | POA: Diagnosis not present

## 2015-01-14 DIAGNOSIS — K1379 Other lesions of oral mucosa: Secondary | ICD-10-CM | POA: Diagnosis not present

## 2015-01-14 DIAGNOSIS — K062 Gingival and edentulous alveolar ridge lesions associated with trauma: Secondary | ICD-10-CM

## 2015-01-14 MED ORDER — NYSTATIN 100000 UNIT/GM EX CREA: 1.0000 "application " | TOPICAL_CREAM | Freq: Two times a day (BID) | CUTANEOUS | Status: DC

## 2015-01-14 MED ORDER — FLUCONAZOLE 150 MG PO TABS: 150.0000 mg | ORAL_TABLET | Freq: Every day | ORAL | Status: DC

## 2015-01-14 NOTE — Progress Notes (Signed)
Patient ID: Jose Brady, male   DOB: 1951-03-06, 64 y.o.   MRN: [id number]  CC: blood in urine  HPI: Jose Brady is a 64 y.o. male here today for evaluation of hematuriat.  Patient has past medical history of HTN, diabetes, and thyroid disease.  He reports that he has noticed blood in his urine for the past 4 days and states that he has been having dysuria. He reports that the tip of his penis and the surrounding foreskin has been red, itchy, and irritated. When he initiates urine stream it is very irritated and painful and blood is noticed as soon as he begins to void.   Patient presents with a letter from Dr. 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 He has prescription that says patient needs carotid doppler and echocardiogram ordered to evaluate for arthrosclerosis.    States that since beginning Gemfibrozil he noticed severe gas and constipation. He stopped taking medication after 3 days.  Patient has No headache, No chest pain, No abdominal pain - No Nausea, No new weakness tingling or numbness, No Cough - SOB.  Allergies  Allergen Reactions  . Penicillins Hives   Past Medical History  Diagnosis Date  . Hypertension   . Diabetes mellitus without complication (HCC)   . Thyroid disease    Current Outpatient Prescriptions on File Prior to Visit  Medication Sig Dispense Refill  . diclofenac (VOLTAREN) 75 MG EC tablet Take one tab BID PRN 180 tablet 2  . esomeprazole (NEXIUM) 40 MG packet Take 40 mg by mouth daily before breakfast. 30 each 12  . gabapentin (NEURONTIN) 300 MG capsule Take 1 capsule (300 mg total) by mouth at bedtime. 30 capsule 4  . gemfibrozil (LOPID) 600 MG tablet Take 1 tablet (600 mg total) by mouth 2 (two) times daily before a meal. 60 tablet 4  . glimepiride (AMARYL) 2 MG tablet Take 1 tablet (2 mg total) by mouth 2 (two) times daily. 60 tablet 4  . hydrocortisone cream 1 %  Apply 1 application topically 2 (two) times daily. 30 g 1  . levothyroxine (SYNTHROID) 50 MCG tablet Take 1 tablet (50 mcg total) by mouth daily before breakfast. 90 tablet 2  . metoprolol succinate (TOPROL-XL) 50 MG 24 hr tablet Take 1 tablet (50 mg total) by mouth daily. Take with or immediately following a meal. 90 tablet 3  . olmesartan-hydrochlorothiazide (BENICAR HCT) 40-12.5 MG per tablet Take 1 tablet by mouth daily. 30 tablet 5  . omeprazole (PRILOSEC) 20 MG capsule Take 1 capsule (20 mg total) by mouth 2 (two) times daily before a meal. 60 capsule 3  . pioglitazone (ACTOS) 15 MG tablet Take 1 tablet (15 mg total) by mouth daily. 30 tablet 3  . traMADol (ULTRAM) 50 MG tablet Take 2 tablets daily as needed for pain (Patient not taking: Reported on 12/14/2014) 60 tablet 0  . [DISCONTINUED] irbesartan (AVAPRO) 300 MG tablet Take 1 tablet (300 mg total) by mouth at bedtime. 30 tablet 5   No current facility-administered medications on file prior to visit.   Family History  Problem Relation Age of Onset  . Hypertension Mother   . Hypertension Father    Social History   Social History  . Marital Status: Married    Spouse Name: N/A  . Number of Children: N/A  . Years of Education: N/A   Occupational History  . Not on file.   Social History Main  Topics  . Smoking status: Never Smoker   . Smokeless tobacco: Not on file  . Alcohol Use: Not on file  . Drug Use: Not on file  . Sexual Activity: Not on file   Other Topics Concern  . Not on file   Social History Narrative    Review of Systems: Other than what is stated in HPI, all other systems are negative.   Objective:   Filed Vitals:   01/14/15 1040  BP: 138/84  Pulse: 92  Temp: 98 F (36.7 C)  Resp: 16    Physical Exam  Constitutional: He is oriented to person, place, and time.  Cardiovascular: Normal rate, regular rhythm and normal heart sounds.   Pulmonary/Chest: Effort normal and breath sounds normal.   Abdominal: Soft. Bowel sounds are normal. There is no tenderness.  No CVA tenderness  Genitourinary: Uncircumcised. Penile erythema and penile tenderness present. No discharge found.  Neurological: He is alert and oriented to person, place, and time.  Psychiatric: He has a normal mood and affect.     Lab Results  Component Value Date   WBC 5.6 07/10/2013   HGB 14.5 07/10/2013   HCT 41.3 07/10/2013   MCV 85.3 07/10/2013   PLT 246 07/10/2013   Lab Results  Component Value Date   CREATININE 1.41* 12/15/2014   BUN 20 12/15/2014   NA 132* 12/15/2014   K 4.7 12/15/2014   CL 100 12/15/2014   CO2 23 12/15/2014    Lab Results  Component Value Date   HGBA1C 6.80 12/14/2014   Lipid Panel     Component Value Date/Time   CHOL 194 12/15/2014 1002   TRIG 373* 12/15/2014 1002   HDL 32* 12/15/2014 1002   CHOLHDL 6.1* 12/15/2014 1002   VLDL 75* 12/15/2014 1002   LDLCALC 87 12/15/2014 1002       Assessment and plan:   Almon was seen today for hematuria.  Diagnoses and all orders for this visit:  Hematuria I believe hematuria is a result of irritation from fungal skin infection. Patient is diabetic and is prone to fungal infections.   Fungal skin infection -     nystatin cream (MYCOSTATIN); Apply 1 application topically 2 (two) times daily. -     fluconazole (DIFLUCAN) 150 MG tablet; Take 1 tablet (150 mg total) by mouth daily. FOR 7 DAYS Will treat as fungal and bring patient back in 2 weeks to recheck urine. If he has not had improvement or still has hematuria then I will send to Urology.   Medication side effect, initial encounter Patient was asked to cut Gemfibrozil in half and try to take smaller dose. I stressed the importance of getting triglycerides controlled. I have stressed that he make diet changes and exercise as well. Patient agrees to try lower dose and call me if he still has complications of flatulence.   Hollenhorst plaque, right eye -      Echocardiogram; Future -     US Carotid Duplex Bilateral; Future -     Carotid; Future Patient will begin taking Aspirin 81 mg daily.   Denture irritation -     Ambulatory referral to Dentistry   Return in about 2 weeks (around 01/28/2015) for Nurse Visit-repeat urine dipstick .        Ambrose FinlandValerie A Gaila Engebretsen, NP-C [facility] [phone] 01/14/2015, 10:35 AM

## 2015-01-14 NOTE — Progress Notes (Signed)
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

## 2015-01-14 NOTE — Patient Instructions (Signed)
I will treat you for a fungal infection first and see if you have improvement. You will come back in 2 weeks with the nurse for a recheck of your urine.

## 2015-01-18 ENCOUNTER — Ambulatory Visit (HOSPITAL_COMMUNITY): Payer: Medicare Other

## 2015-01-18 ENCOUNTER — Encounter (HOSPITAL_COMMUNITY): Payer: Medicare Other

## 2015-01-19 ENCOUNTER — Ambulatory Visit (HOSPITAL_BASED_OUTPATIENT_CLINIC_OR_DEPARTMENT_OTHER)
Admission: RE | Admit: 2015-01-19 | Discharge: 2015-01-19 | Disposition: A | Discharge status: Home / Self Care | Payer: Medicare Other | Source: Ambulatory Visit | Attending: Internal Medicine | Admitting: Internal Medicine

## 2015-01-19 ENCOUNTER — Ambulatory Visit (HOSPITAL_COMMUNITY)
Admission: RE | Admit: 2015-01-19 | Discharge: 2015-01-19 | Disposition: A | Discharge status: Home / Self Care | Payer: Medicare Other | Source: Ambulatory Visit | Attending: Internal Medicine | Admitting: Internal Medicine

## 2015-01-19 DIAGNOSIS — E119 Type 2 diabetes mellitus without complications: Secondary | ICD-10-CM | POA: Diagnosis not present

## 2015-01-19 DIAGNOSIS — I10 Essential (primary) hypertension: Secondary | ICD-10-CM | POA: Diagnosis not present

## 2015-01-19 DIAGNOSIS — E785 Hyperlipidemia, unspecified: Secondary | ICD-10-CM | POA: Diagnosis not present

## 2015-01-19 DIAGNOSIS — H34211 Partial retinal artery occlusion, right eye: Secondary | ICD-10-CM

## 2015-01-19 DIAGNOSIS — I351 Nonrheumatic aortic (valve) insufficiency: Secondary | ICD-10-CM | POA: Insufficient documentation

## 2015-01-19 DIAGNOSIS — I340 Nonrheumatic mitral (valve) insufficiency: Secondary | ICD-10-CM | POA: Diagnosis not present

## 2015-01-19 DIAGNOSIS — I071 Rheumatic tricuspid insufficiency: Secondary | ICD-10-CM | POA: Insufficient documentation

## 2015-01-19 NOTE — Progress Notes (Signed)
*  PRELIMINARY RESULTS* Vascular Ultrasound Carotid Duplex (Doppler) has been completed.  Preliminary findings: Bilateral: No significant (1-39%) ICA stenosis. Antegrade vertebral flow.    Farrel DemarkJill Eunice, RDMS, RVT  01/19/2015, 10:38 AM

## 2015-01-19 NOTE — Progress Notes (Signed)
  Echocardiogram 2D Echocardiogram has been performed.  Janalyn HarderWest, Chantry Headen R 01/19/2015, 10:15 AM

## 2015-01-28 ENCOUNTER — Ambulatory Visit: Payer: Medicare Other | Attending: Internal Medicine

## 2015-01-28 VITALS — BP 110/69 | HR 83 | Temp 98.2°F | Resp 18 | Ht 63.0 in | Wt 162.0 lb

## 2015-01-28 DIAGNOSIS — R319 Hematuria, unspecified: Secondary | ICD-10-CM | POA: Insufficient documentation

## 2015-01-28 LAB — POCT URINALYSIS DIPSTICK
Bilirubin, UA: NEGATIVE
Glucose, UA: 500
Ketones, UA: NEGATIVE
LEUKOCYTES UA: NEGATIVE
Nitrite, UA: NEGATIVE
PH UA: 5.5
PROTEIN UA: NEGATIVE
Spec Grav, UA: 1.025
UROBILINOGEN UA: 0.2

## 2015-01-28 NOTE — Patient Instructions (Signed)
Please make an appointment for diabetic follow up on or after 03/15/15.

## 2015-01-28 NOTE — Progress Notes (Signed)
Patient here today to have urine rechecked for blood.  Nurse called interpreter, patient reports not needing interpreter. Patient understands and speaks english.  Patient reports feeling good and has no issues at this time.  Nurse consulted with Vikki PortsValerie. Vikki PortsValerie is aware of UA results, advised nurse to tell patient urine has improved and will recheck again at his 3 month DM follow up. Patient aware of Valerie's recommendations. Patient voices understanding and has no further questions at this time. Patient questioned ECHO results from 01/19/15. Per Vikki PortsValerie, results are normal. Patient aware.

## 2015-02-03 ENCOUNTER — Telehealth: Payer: Self-pay | Admitting: Internal Medicine

## 2015-02-03 DIAGNOSIS — E038 Other specified hypothyroidism: Secondary | ICD-10-CM

## 2015-02-03 NOTE — Telephone Encounter (Signed)
Fax was sent requesting a med refill for LEVOTHYROXINE SODIUM 0.05 MG TAB. Please f/u

## 2015-02-04 MED ORDER — LEVOTHYROXINE SODIUM 50 MCG PO TABS: 50.0000 ug | ORAL_TABLET | Freq: Every day | ORAL | Status: DC

## 2015-03-08 ENCOUNTER — Other Ambulatory Visit: Payer: Self-pay | Admitting: Internal Medicine

## 2015-03-08 DIAGNOSIS — I10 Essential (primary) hypertension: Secondary | ICD-10-CM

## 2015-03-09 ENCOUNTER — Emergency Department (HOSPITAL_COMMUNITY)
Admission: EM | Admit: 2015-03-09 | Discharge: 2015-03-09 | Disposition: A | Discharge status: Home / Self Care | Payer: Medicare Other | Attending: Emergency Medicine | Admitting: Emergency Medicine

## 2015-03-09 ENCOUNTER — Encounter (HOSPITAL_COMMUNITY): Payer: Self-pay | Admitting: Emergency Medicine

## 2015-03-09 ENCOUNTER — Emergency Department (HOSPITAL_COMMUNITY): Payer: Medicare Other

## 2015-03-09 DIAGNOSIS — Z79899 Other long term (current) drug therapy: Secondary | ICD-10-CM | POA: Insufficient documentation

## 2015-03-09 DIAGNOSIS — E119 Type 2 diabetes mellitus without complications: Secondary | ICD-10-CM | POA: Insufficient documentation

## 2015-03-09 DIAGNOSIS — K8501 Idiopathic acute pancreatitis with uninfected necrosis: Secondary | ICD-10-CM | POA: Diagnosis not present

## 2015-03-09 DIAGNOSIS — M549 Dorsalgia, unspecified: Secondary | ICD-10-CM | POA: Insufficient documentation

## 2015-03-09 DIAGNOSIS — Z7984 Long term (current) use of oral hypoglycemic drugs: Secondary | ICD-10-CM | POA: Diagnosis not present

## 2015-03-09 DIAGNOSIS — K8500 Idiopathic acute pancreatitis without necrosis or infection: Secondary | ICD-10-CM

## 2015-03-09 DIAGNOSIS — Z9049 Acquired absence of other specified parts of digestive tract: Secondary | ICD-10-CM | POA: Insufficient documentation

## 2015-03-09 DIAGNOSIS — K30 Functional dyspepsia: Secondary | ICD-10-CM | POA: Insufficient documentation

## 2015-03-09 DIAGNOSIS — Z880 Allergy status to penicillin: Secondary | ICD-10-CM | POA: Diagnosis not present

## 2015-03-09 DIAGNOSIS — R1013 Epigastric pain: Secondary | ICD-10-CM

## 2015-03-09 DIAGNOSIS — Z7982 Long term (current) use of aspirin: Secondary | ICD-10-CM | POA: Diagnosis not present

## 2015-03-09 DIAGNOSIS — I10 Essential (primary) hypertension: Secondary | ICD-10-CM | POA: Diagnosis not present

## 2015-03-09 DIAGNOSIS — E079 Disorder of thyroid, unspecified: Secondary | ICD-10-CM | POA: Diagnosis not present

## 2015-03-09 DIAGNOSIS — R110 Nausea: Secondary | ICD-10-CM | POA: Insufficient documentation

## 2015-03-09 LAB — URINALYSIS, ROUTINE W REFLEX MICROSCOPIC
Glucose, UA: 250 mg/dL — AB
Hgb urine dipstick: NEGATIVE
KETONES UR: 15 mg/dL — AB
LEUKOCYTES UA: NEGATIVE
NITRITE: NEGATIVE
PH: 5 (ref 5.0–8.0)
Protein, ur: NEGATIVE mg/dL
SPECIFIC GRAVITY, URINE: 1.024 (ref 1.005–1.030)

## 2015-03-09 LAB — COMPREHENSIVE METABOLIC PANEL
ALT: 18 U/L (ref 17–63)
AST: 19 U/L (ref 15–41)
Albumin: 3.5 g/dL (ref 3.5–5.0)
Alkaline Phosphatase: 65 U/L (ref 38–126)
Anion gap: 8 (ref 5–15)
BUN: 28 mg/dL — ABNORMAL HIGH (ref 6–20)
CO2: 22 mmol/L (ref 22–32)
CREATININE: 1.97 mg/dL — AB (ref 0.61–1.24)
Calcium: 8.8 mg/dL — ABNORMAL LOW (ref 8.9–10.3)
Chloride: 107 mmol/L (ref 101–111)
GFR calc Af Amer: 39 mL/min — ABNORMAL LOW (ref >60)
GFR calc non Af Amer: 34 mL/min — ABNORMAL LOW (ref >60)
Glucose, Bld: 76 mg/dL (ref 65–99)
POTASSIUM: 4.7 mmol/L (ref 3.5–5.1)
SODIUM: 137 mmol/L (ref 135–145)
Total Bilirubin: 0.8 mg/dL (ref 0.3–1.2)
Total Protein: 6.6 g/dL (ref 6.5–8.1)

## 2015-03-09 LAB — I-STAT TROPONIN, ED: TROPONIN I, POC: 0 ng/mL (ref 0.00–0.08)

## 2015-03-09 LAB — CBC
HEMATOCRIT: 38.9 % — AB (ref 39.0–52.0)
HEMOGLOBIN: 12.6 g/dL — AB (ref 13.0–17.0)
MCH: 29.3 pg (ref 26.0–34.0)
MCHC: 32.4 g/dL (ref 30.0–36.0)
MCV: 90.5 fL (ref 78.0–100.0)
Platelets: 258 10*3/uL (ref 150–400)
RBC: 4.3 MIL/uL (ref 4.22–5.81)
RDW: 13 % (ref 11.5–15.5)
WBC: 5.7 10*3/uL (ref 4.0–10.5)

## 2015-03-09 LAB — LIPASE, BLOOD: LIPASE: 107 U/L — AB (ref 11–51)

## 2015-03-09 LAB — AMYLASE: AMYLASE: 81 U/L (ref 28–100)

## 2015-03-09 MED ORDER — RANITIDINE HCL 150 MG PO TABS: 150.0000 mg | ORAL_TABLET | Freq: Two times a day (BID) | ORAL | Status: DC

## 2015-03-09 MED ORDER — ONDANSETRON HCL 4 MG/2ML IJ SOLN
4.0000 mg | Freq: Once | INTRAMUSCULAR | Status: AC
  Administered 2015-03-09: 4 mg via INTRAVENOUS
  Filled 2015-03-09: qty 2

## 2015-03-09 MED ORDER — SODIUM CHLORIDE 0.9 % IV BOLUS (SEPSIS)
1000.0000 mL | Freq: Once | INTRAVENOUS | Status: AC
  Administered 2015-03-09: 1000 mL via INTRAVENOUS

## 2015-03-09 MED ORDER — PANTOPRAZOLE SODIUM 20 MG PO TBEC: 20.0000 mg | DELAYED_RELEASE_TABLET | Freq: Every day | ORAL | Status: DC

## 2015-03-09 MED ORDER — FENTANYL CITRATE (PF) 100 MCG/2ML IJ SOLN
100.0000 ug | Freq: Once | INTRAMUSCULAR | Status: AC
  Administered 2015-03-09: 100 ug via INTRAVENOUS
  Filled 2015-03-09: qty 2

## 2015-03-09 MED ORDER — ALUM & MAG HYDROXIDE-SIMETH 200-200-20 MG/5ML PO SUSP
15.0000 mL | Freq: Once | ORAL | Status: AC
  Administered 2015-03-09: 15 mL via ORAL
  Filled 2015-03-09: qty 30

## 2015-03-09 MED ORDER — LIDOCAINE VISCOUS 2 % MT SOLN
15.0000 mL | Freq: Once | OROMUCOSAL | Status: AC
  Administered 2015-03-09: 15 mL via OROMUCOSAL
  Filled 2015-03-09: qty 15

## 2015-03-09 NOTE — ED Notes (Signed)
Pt left at this time with all belongings. Refused wheelchair. 

## 2015-03-09 NOTE — ED Notes (Signed)
C/o severe mid upper abd pain that radiates to back since 5pm.  Denies nausea, vomiting, sob, or any other associated symptoms.

## 2015-03-09 NOTE — ED Notes (Signed)
MD at bedside. 

## 2015-03-09 NOTE — ED Provider Notes (Signed)
CSN: [id number]     Arrival date & time 03/09/15  2119 History   First MD Initiated Contact with Patient 03/09/15 2154     Chief Complaint  Patient presents with  . Abdominal Pain  . Back Pain   Patient is a 64 y.o. male presenting with abdominal pain. The history is provided by the patient and a friend.  Abdominal Pain Pain location:  Epigastric Pain quality: burning   Pain radiates to:  Back Pain severity:  Moderate Onset quality:  Gradual Duration:  1 day Timing:  Constant Chronicity:  New Relieved by:  Nothing Worsened by:  Eating Ineffective treatments:  None tried Associated symptoms: belching and nausea   Associated symptoms: no chest pain, no chills, no constipation, no cough, no diarrhea, no dysuria, no fever, no hematochezia, no hematuria, no melena, no shortness of breath, no sore throat and no vomiting     Past Medical History  Diagnosis Date  . Hypertension   . Diabetes mellitus without complication (HCC)   . Thyroid disease    Past Surgical History  Procedure Laterality Date  . Cholecystectomy     Family History  Problem Relation Age of Onset  . Hypertension Mother   . Hypertension Father    Social History  Substance Use Topics  . Smoking status: Never Smoker   . Smokeless tobacco: None  . Alcohol Use: No    Review of Systems  Constitutional: Negative for fever and chills.  HENT: Negative for rhinorrhea and sore throat.   Eyes: Negative for visual disturbance.  Respiratory: Negative for cough and shortness of breath.   Cardiovascular: Negative for chest pain.  Gastrointestinal: Positive for nausea and abdominal pain. Negative for vomiting, diarrhea, constipation, melena and hematochezia.  Genitourinary: Negative for dysuria and hematuria.  Musculoskeletal: Positive for back pain. Negative for neck pain.  Skin: Negative for rash.  Neurological: Negative for syncope and headaches.  Psychiatric/Behavioral: Negative for confusion.  All other  systems reviewed and are negative.  Allergies  Penicillins  Home Medications   Prior to Admission medications   Medication Sig Start Date End Date Taking? Authorizing Provider  aspirin 81 MG tablet Take 81 mg by mouth daily.   Yes Historical Provider, MD  gabapentin (NEURONTIN) 300 MG capsule Take 1 capsule (300 mg total) by mouth at bedtime. 12/14/14  Yes Ambrose FinlandValerie A Keck, NP  gemfibrozil (LOPID) 600 MG tablet Take 1 tablet (600 mg total) by mouth 2 (two) times daily before a meal. 12/20/14  Yes Ambrose FinlandValerie A Keck, NP  glimepiride (AMARYL) 2 MG tablet Take 1 tablet (2 mg total) by mouth 2 (two) times daily. 12/14/14  Yes Ambrose FinlandValerie A Keck, NP  levothyroxine (SYNTHROID) 50 MCG tablet Take 1 tablet (50 mcg total) by mouth daily before breakfast. 02/04/15  Yes Ambrose FinlandValerie A Keck, NP  metoprolol succinate (TOPROL-XL) 50 MG 24 hr tablet TAKE 1 TABLET BY MOUTH DAILY WITH OR IMMEDIATELY FOLLOWING A MEAL 03/08/15  Yes Ambrose FinlandValerie A Keck, NP  olmesartan-hydrochlorothiazide (BENICAR HCT) 40-12.5 MG per tablet Take 1 tablet by mouth daily. 12/14/14  Yes Ambrose FinlandValerie A Keck, NP  diclofenac (VOLTAREN) 75 MG EC tablet Take one tab BID PRN Patient not taking: Reported on 03/09/2015 07/23/14   Doris Cheadleeepak Advani, MD  esomeprazole (NEXIUM) 40 MG packet Take 40 mg by mouth daily before breakfast. Patient not taking: Reported on 03/09/2015 11/02/13   Doris Cheadleeepak Advani, MD  fluconazole (DIFLUCAN) 150 MG tablet Take 1 tablet (150 mg total) by mouth daily. FOR 7 DAYS Patient  not taking: Reported on 03/09/2015 01/14/15   Ambrose Finland, NP  hydrocortisone cream 1 % Apply 1 application topically 2 (two) times daily. Patient not taking: Reported on 03/09/2015 07/23/14   Doris Cheadle, MD  nystatin cream (MYCOSTATIN) Apply 1 application topically 2 (two) times daily. Patient not taking: Reported on 03/09/2015 01/14/15   Ambrose Finland, NP  omeprazole (PRILOSEC) 20 MG capsule Take 1 capsule (20 mg total) by mouth 2 (two) times daily before a  meal. Patient not taking: Reported on 03/09/2015 12/14/14   Ambrose Finland, NP  pantoprazole (PROTONIX) 20 MG tablet Take 1 tablet (20 mg total) by mouth daily. 03/09/15   Maris Berger, MD  pioglitazone (ACTOS) 15 MG tablet Take 1 tablet (15 mg total) by mouth daily. Patient not taking: Reported on 03/09/2015 12/20/14   Ambrose Finland, NP  ranitidine (ZANTAC) 150 MG tablet Take 1 tablet (150 mg total) by mouth 2 (two) times daily. 03/09/15   Maris Berger, MD  traMADol (ULTRAM) 50 MG tablet Take 2 tablets daily as needed for pain Patient not taking: Reported on 12/14/2014 07/05/14   Ozzie Hoyle Draper, DO   BP 117/75 mmHg  Pulse 70  Temp(Src) 97.9 F (36.6 C) (Oral)  Resp 12  SpO2 100% Physical Exam  Constitutional: He is oriented to person, place, and time. He appears well-developed and well-nourished. No distress.  HENT:  Head: Normocephalic and atraumatic.  Mouth/Throat: Oropharynx is clear and moist.  Eyes: EOM are normal.  Neck: Neck supple. No JVD present.  Cardiovascular: Normal rate, regular rhythm, normal heart sounds and intact distal pulses.   Pulmonary/Chest: Effort normal and breath sounds normal.  Abdominal: Soft. He exhibits no distension. There is tenderness in the epigastric area. There is no rigidity, no rebound, no guarding and no CVA tenderness.  Musculoskeletal: Normal range of motion. He exhibits no edema.       Cervical back: He exhibits no tenderness.       Thoracic back: He exhibits no tenderness.       Lumbar back: He exhibits no tenderness.  Neurological: He is alert and oriented to person, place, and time. No cranial nerve deficit.  Skin: Skin is warm and dry.  Psychiatric: His behavior is normal.    ED Course  Procedures  NONE   Labs Review Labs Reviewed  LIPASE, BLOOD - Abnormal; Notable for the following:    Lipase 107 (*)    All other components within normal limits  COMPREHENSIVE METABOLIC PANEL - Abnormal; Notable for the following:    BUN 28  (*)    Creatinine, Ser 1.97 (*)    Calcium 8.8 (*)    GFR calc non Af Amer 34 (*)    GFR calc Af Amer 39 (*)    All other components within normal limits  CBC - Abnormal; Notable for the following:    Hemoglobin 12.6 (*)    HCT 38.9 (*)    All other components within normal limits  URINALYSIS, ROUTINE W REFLEX MICROSCOPIC (NOT AT [facility]) - Abnormal; Notable for the following:    Color, Urine AMBER (*)    APPearance CLOUDY (*)    Glucose, UA 250 (*)    Bilirubin Urine SMALL (*)    Ketones, ur 15 (*)    All other components within normal limits  AMYLASE  I-STAT TROPOININ, ED    Imaging Review Dg Chest 2 View  03/09/2015  CLINICAL DATA:  Epigastrium BILATERAL flank pain since 1700 hours tonight, diabetes  mellitus, hypertension EXAM: CHEST  2 VIEW COMPARISON:  None FINDINGS: Enlargement of cardiac silhouette. Pulmonary vascularity normal. Abnormal prominence of RIGHT hilum, potentially vascular in origin but unable to exclude adenopathy. Questionable nodular density 7 mm diameter LEFT base. Minimal RIGHT upper lobe scarring. Remaining lungs clear. No pleural effusion or pneumothorax. Bones unremarkable. IMPRESSION: 7 mm nodular density at LEFT base with questionable prominence/enlargement of RIGHT hilum; CT chest with contrast recommended to further assess. Electronically Signed   By: Ulyses Southward M.D.   On: 03/09/2015 21:58   I have personally reviewed and evaluated these images and lab results as part of my medical decision-making.  MDM   Final diagnoses:  Idiopathic acute pancreatitis  Dyspepsia   Patient is a 64 year old [place] male with history of hypertension and diabetes who presents with epigastric pain that radiates to his back. Pain is worse with eating spicy foods. Also reports recent belching. He has tenderness to palpation in the epigastrium without peritonitis. No bony tenderness of the spine. He denies vomiting but does report nausea. No black tarry stools or bright red  blood in her rectum. Lipase is mildly elevated. Normal amylase. Jamesetta So is consistent with pancreatitis. Status post cholecystectomy so doubt gallstone pancreatitis. Patient does not drink alcohol daily or smoke cigarettes. No abdominal ecchymosis. No leukocytosis or toxic appearance. Doubt necrotizing or hemorrhagic pancreatitis. GI cocktail given along with IV fluids, antiemetics and fentanyl. Patient's symptoms dramatically improved in the ED. Will discharge home with Protonix and Zantac. Instructed him to follow up with his PCP for further management. Patient stable for discharge at this time.  Discussed with Dr. Clayborne Dana.   Maris Berger, MD 03/10/15 0005  Marily Memos, MD 03/10/15 [phone]

## 2015-03-09 NOTE — ED Notes (Signed)
Pt at xray

## 2015-03-11 ENCOUNTER — Other Ambulatory Visit: Payer: Self-pay | Admitting: Internal Medicine

## 2015-03-15 ENCOUNTER — Other Ambulatory Visit: Payer: Self-pay | Admitting: Internal Medicine

## 2015-03-17 ENCOUNTER — Telehealth: Payer: Self-pay | Admitting: Internal Medicine

## 2015-03-17 NOTE — Telephone Encounter (Signed)
Diagnoses this visit    Your diagnoses were IDIOPATHIC ACUTE PANCREATITIS and DYSPEPSIA.    Follow-up Information    Follow up with Ambrose FinlandValerie A Keck, NP. Schedule an appointment as soon as possible for a visit in 1 week.   Specialty: Internal Medicine   Contact information:   [address] [place] [address] [phone]      Patient recently went to the ED for indigestion and pancreatitis and is in a lot of pain. Pt stopped taking diclofenac (VOLTAREN) 75 MG EC tablet because symptoms began to worsen and fears that it is due to the medication. We dont have any available appt's until Jan. Please follow up with pt.

## 2015-03-18 ENCOUNTER — Telehealth: Payer: Self-pay

## 2015-03-18 NOTE — Telephone Encounter (Signed)
Returned patient phone call Patient not available at this time per his son His son will relay the message and have Jose Brady  Return our phone call

## 2015-03-21 ENCOUNTER — Other Ambulatory Visit: Payer: Self-pay | Admitting: Internal Medicine

## 2015-03-23 ENCOUNTER — Other Ambulatory Visit: Payer: Self-pay | Admitting: Internal Medicine

## 2015-04-18 ENCOUNTER — Other Ambulatory Visit: Payer: Self-pay | Admitting: Internal Medicine

## 2015-04-22 ENCOUNTER — Other Ambulatory Visit: Payer: Self-pay | Admitting: Internal Medicine

## 2015-04-25 ENCOUNTER — Telehealth: Payer: Self-pay | Admitting: Internal Medicine

## 2015-04-25 NOTE — Telephone Encounter (Signed)
Patient came in presenting paperwork stating what medicines will be covered by insurance and will need changes. Omega 3 will not be covered, and alternative is Vascep Benicar is no longer covered, and alternative is Losartan or Valsartan  Please follow up.

## 2015-04-29 ENCOUNTER — Telehealth: Payer: Self-pay

## 2015-04-29 ENCOUNTER — Other Ambulatory Visit: Payer: Self-pay | Admitting: Internal Medicine

## 2015-04-29 DIAGNOSIS — I10 Essential (primary) hypertension: Secondary | ICD-10-CM

## 2015-04-29 DIAGNOSIS — E781 Pure hyperglyceridemia: Secondary | ICD-10-CM

## 2015-04-29 MED ORDER — ICOSAPENT ETHYL 1 G PO CAPS: 2.0000 g | ORAL_CAPSULE | Freq: Two times a day (BID) | ORAL | Status: DC

## 2015-04-29 MED ORDER — LOSARTAN POTASSIUM-HCTZ 100-12.5 MG PO TABS: 1.0000 | ORAL_TABLET | Freq: Every day | ORAL | Status: DC

## 2015-04-29 NOTE — Telephone Encounter (Signed)
Tried to contact patient Patient not available Message left on voice mail to return our call 

## 2015-05-18 ENCOUNTER — Other Ambulatory Visit: Payer: Self-pay | Admitting: Internal Medicine

## 2015-05-20 ENCOUNTER — Other Ambulatory Visit: Payer: Self-pay | Admitting: Internal Medicine

## 2015-06-06 ENCOUNTER — Ambulatory Visit: Payer: Medicare Other | Admitting: Internal Medicine

## 2015-06-16 ENCOUNTER — Encounter: Payer: Self-pay | Admitting: Clinical

## 2015-06-16 ENCOUNTER — Ambulatory Visit: Payer: Medicare Other | Attending: Internal Medicine | Admitting: Internal Medicine

## 2015-06-16 ENCOUNTER — Encounter: Payer: Self-pay | Admitting: Internal Medicine

## 2015-06-16 VITALS — BP 122/79 | HR 72 | Temp 97.5°F | Resp 18 | Ht 63.0 in | Wt 165.4 lb

## 2015-06-16 DIAGNOSIS — E039 Hypothyroidism, unspecified: Secondary | ICD-10-CM | POA: Insufficient documentation

## 2015-06-16 DIAGNOSIS — I10 Essential (primary) hypertension: Secondary | ICD-10-CM

## 2015-06-16 DIAGNOSIS — Z79899 Other long term (current) drug therapy: Secondary | ICD-10-CM | POA: Diagnosis not present

## 2015-06-16 DIAGNOSIS — E781 Pure hyperglyceridemia: Secondary | ICD-10-CM | POA: Insufficient documentation

## 2015-06-16 DIAGNOSIS — E119 Type 2 diabetes mellitus without complications: Secondary | ICD-10-CM | POA: Diagnosis present

## 2015-06-16 DIAGNOSIS — Z7982 Long term (current) use of aspirin: Secondary | ICD-10-CM | POA: Diagnosis not present

## 2015-06-16 DIAGNOSIS — K219 Gastro-esophageal reflux disease without esophagitis: Secondary | ICD-10-CM | POA: Diagnosis not present

## 2015-06-16 DIAGNOSIS — M25551 Pain in right hip: Secondary | ICD-10-CM | POA: Diagnosis not present

## 2015-06-16 DIAGNOSIS — Z23 Encounter for immunization: Secondary | ICD-10-CM | POA: Diagnosis not present

## 2015-06-16 DIAGNOSIS — E038 Other specified hypothyroidism: Secondary | ICD-10-CM

## 2015-06-16 LAB — POCT GLYCOSYLATED HEMOGLOBIN (HGB A1C): HEMOGLOBIN A1C: 6.4

## 2015-06-16 LAB — GLUCOSE, POCT (MANUAL RESULT ENTRY): POC GLUCOSE: 214 mg/dL — AB (ref 70–99)

## 2015-06-16 MED ORDER — METOPROLOL SUCCINATE ER 50 MG PO TB24: ORAL_TABLET | ORAL | Status: DC

## 2015-06-16 MED ORDER — HYDROCORTISONE 1 % EX CREA: 1.0000 "application " | TOPICAL_CREAM | Freq: Two times a day (BID) | CUTANEOUS | Status: DC

## 2015-06-16 MED ORDER — GLIMEPIRIDE 2 MG PO TABS: 2.0000 mg | ORAL_TABLET | Freq: Two times a day (BID) | ORAL | Status: DC

## 2015-06-16 MED ORDER — PANTOPRAZOLE SODIUM 20 MG PO TBEC: 20.0000 mg | DELAYED_RELEASE_TABLET | Freq: Every day | ORAL | Status: DC

## 2015-06-16 MED ORDER — GABAPENTIN 300 MG PO CAPS: 300.0000 mg | ORAL_CAPSULE | Freq: Every day | ORAL | Status: DC

## 2015-06-16 MED ORDER — PIOGLITAZONE HCL 15 MG PO TABS: 15.0000 mg | ORAL_TABLET | Freq: Every day | ORAL | Status: DC

## 2015-06-16 MED ORDER — LOSARTAN POTASSIUM-HCTZ 100-12.5 MG PO TABS: 1.0000 | ORAL_TABLET | Freq: Every day | ORAL | Status: DC

## 2015-06-16 MED ORDER — OMEGA-3-ACID ETHYL ESTERS 1 G PO CAPS: 2.0000 g | ORAL_CAPSULE | Freq: Two times a day (BID) | ORAL | Status: DC

## 2015-06-16 MED ORDER — LEVOTHYROXINE SODIUM 50 MCG PO TABS: 50.0000 ug | ORAL_TABLET | Freq: Every day | ORAL | Status: DC

## 2015-06-16 NOTE — Progress Notes (Signed)
Dep[facility] screen [facility] 2[facility] 06/16/2015 08/09/2014 07/27/2014 07/27/2014 07/05/2014  Decreased Interest 3 0 0 0 0  Down, Depressed, Hopeless 0 0 0 0 0  PHQ - 2 Score 3 0 0 0 0  Altered sleeping 0 - - - -  Tired, decreased energy 0 - - - -  Change in appetite 0 - - - -  Feeling bad or failure about yourself  0 - - - -  Trouble concentrating 0 - - - -  Moving slowly or fidgety/restless 0 - - - -  Suicidal thoughts 0 - - - -  PHQ-9 Score 3 - - - -    GAD 7 : Generalized Anxiety Score 06/16/2015  Nervous, Anxious, on Edge 0  Control/stop worrying 0  Worry too much - different things 0  Trouble relaxing 0  Restless 0  Easily annoyed or irritable 0  Afraid - awful might happen 0  Total GAD 7 Score 0

## 2015-06-16 NOTE — Progress Notes (Signed)
Patient is here for FU DM  Patient complains of anal itching and would like a cream.  Patient has taken medications and has eaten this morning.

## 2015-06-16 NOTE — Progress Notes (Signed)
Patient ID: Jose Brady, male   DOB: 02/08/1951, 65 y.o.   MRN: [id number] SUBJECTIVE: 65 y.o. male for follow up of diabetes and hypertension. Patient has a past medical history of GERD, hypertriglyceridemia, thyroid disease, and chronic back pain. He takes all medications without complications. Diabetic Review of Systems - medication compliance: compliant all of the time, diabetic diet compliance: compliant most of the time, home glucose monitoring: is performed regularly, further diabetic ROS: no polyuria or polydipsia, no chest pain, dyspnea or TIA's, no numbness, tingling or pain in extremities, no unusual visual symptoms, no hypoglycemia.  Other symptoms and concerns: He would like refills on all medications today.  Current Outpatient Prescriptions  Medication Sig Dispense Refill  . gabapentin (NEURONTIN) 300 MG capsule Take 1 capsule (300 mg total) by mouth at bedtime. 30 capsule 4  . glimepiride (AMARYL) 2 MG tablet Take 1 tablet (2 mg total) by mouth 2 (two) times daily. 60 tablet 4  . Icosapent Ethyl 1 g CAPS Take 2 g by mouth 2 (two) times daily with a meal. 120 capsule 4  . levothyroxine (SYNTHROID) 50 MCG tablet Take 1 tablet (50 mcg total) by mouth daily before breakfast. 90 tablet 1  . losartan-hydrochlorothiazide (HYZAAR) 100-12.5 MG tablet Take 1 tablet by mouth daily. 90 tablet 1  . metoprolol succinate (TOPROL-XL) 50 MG 24 hr tablet TAKE 1 TABLET BY MOUTH DAILY WITH OR IMMEDIATELY FOLLOWING A MEAL 90 tablet 1  . omega-3 acid ethyl esters (LOVAZA) 1 g capsule Take 2 capsules (2 g total) by mouth 2 (two) times daily. Must have office visit for refills 360 capsule 1  . pantoprazole (PROTONIX) 20 MG tablet Take 1 tablet (20 mg total) by mouth daily. 90 tablet 1  . pioglitazone (ACTOS) 15 MG tablet Take 1 tablet (15 mg total) by mouth daily. Must have office visit for refills 90 tablet 1  . aspirin 81 MG tablet Take 81 mg by mouth daily. Reported on 06/16/2015    .  hydrocortisone cream 1 % Apply 1 application topically 2 (two) times daily. 30 g 1  . [DISCONTINUED] irbesartan (AVAPRO) 300 MG tablet Take 1 tablet (300 mg total) by mouth at bedtime. 30 tablet 5   No current facility-administered medications for this visit.  ROS: Other than what is stated in HPI, all other systems are negative.   OBJECTIVE: Appearance: alert, well appearing, and in no distress, oriented to person, place, and time and normal appearing weight. BP 122/79 mmHg  Pulse 72  Temp(Src) 97.5 F (36.4 C) (Oral)  Resp 18  Ht 5\' 3"  (1.6 m)  Wt 165 lb 6.4 oz (75.025 kg)  BMI 29.31 kg/m2  SpO2 100%  Exam: heart sounds normal rate, regular rhythm, normal S1, S2, no murmurs, rubs, clicks or gallops, no JVD, chest clear, no hepatosplenomegaly, no carotid bruits, feet: warm, good capillary refill, no trophic changes or ulcerative lesions, normal DP and PT pulses, normal monofilament exam and normal sensory exam  ASSESSMENT: Jose Brady was seen today for follow-up.  Diagnoses and all orders for this visit:  Type 2 diabetes mellitus without complication, without long-term current use of insulin (HCC) -     POCT A1C -     Glucose (CBG) -     glimepiride (AMARYL) 2 MG tablet; Take 1 tablet (2 mg total) by mouth 2 (two) times daily. -     pioglitazone (ACTOS) 15 MG tablet; Take 1 tablet (15 mg total) by mouth daily. Must have office visit for refills  Patients diabetes is well control as evidence by consistently low a1c.  Patient will continue with current therapy and continue to make necessary lifestyle changes.  Reviewed foot care, diet, exercise, annual health maintenance with patient.   Right hip pain -     gabapentin (NEURONTIN) 300 MG capsule; Take 1 capsule (300 mg total) by mouth at bedtime. Stable, meds refilled   Essential hypertension -     losartan-hydrochlorothiazide (HYZAAR) 100-12.5 MG tablet; Take 1 tablet by mouth daily. -     metoprolol succinate (TOPROL-XL) 50 MG 24  hr tablet; TAKE 1 TABLET BY MOUTH DAILY WITH OR IMMEDIATELY FOLLOWING A MEAL Patient blood pressure is stable and may continue on current medication.  Education on diet, exercise, and modifiable risk factors discussed. Will obtain appropriate labs as needed. Will follow up in 3-6 months.   Hypertriglyceridemia -     omega-3 acid ethyl esters (LOVAZA) 1 g capsule; Take 2 capsules (2 g total) by mouth 2 (two) times daily. Must have office visit for refills He will return tomorrow for a repeat Lipid panel  Other specified hypothyroidism -     levothyroxine (SYNTHROID) 50 MCG tablet; Take 1 tablet (50 mcg total) by mouth daily before breakfast. Stable, last lab was normal  Gastroesophageal reflux disease, esophagitis presence not specified -     pantoprazole (PROTONIX) 20 MG tablet; Take 1 tablet (20 mg total) by mouth daily. Discussed diet and weight with patient relating to acid reflux.  Went over things that may exacerbate acid reflux such as tomatoes, spicy foods, coffee, carbonated beverages, chocolates, etc.  Advised patient to avoid laying down at least two hours after meals and sleep with HOB elevated.   Need for prophylactic vaccination against Streptococcus pneumoniae (pneumococcus) -     Pneumococcal conjugate vaccine 13-valent Given in office today  PLAN: See orders for this visit as documented in the electronic medical record. Issues reviewed with him: diabetic diet discussed in detail, written exchange diet given, low cholesterol diet, weight control and daily exercise discussed, annual eye examinations at Ophthalmology discussed and long term diabetic complications discussed.  Return in about 1 day (around 06/17/2015) for Lab Visit and 3 mo PCP , DM/HTN.  Jose Finland, NP 06/16/2015 2:35 PM

## 2015-06-20 ENCOUNTER — Other Ambulatory Visit: Payer: Self-pay | Admitting: Internal Medicine

## 2015-06-20 ENCOUNTER — Telehealth: Payer: Self-pay

## 2015-06-20 ENCOUNTER — Ambulatory Visit: Payer: Medicare Other | Attending: Internal Medicine

## 2015-06-20 DIAGNOSIS — E781 Pure hyperglyceridemia: Secondary | ICD-10-CM | POA: Insufficient documentation

## 2015-06-20 DIAGNOSIS — I10 Essential (primary) hypertension: Secondary | ICD-10-CM | POA: Diagnosis not present

## 2015-06-20 LAB — BASIC METABOLIC PANEL
BUN: 21 mg/dL (ref 7–25)
CALCIUM: 8.8 mg/dL (ref 8.6–10.3)
CHLORIDE: 104 mmol/L (ref 98–110)
CO2: 23 mmol/L (ref 20–31)
CREATININE: 1.4 mg/dL — AB (ref 0.70–1.25)
GLUCOSE: 99 mg/dL (ref 65–99)
Potassium: 4.8 mmol/L (ref 3.5–5.3)
Sodium: 139 mmol/L (ref 135–146)

## 2015-06-20 LAB — LIPID PANEL
CHOL/HDL RATIO: 3.3 ratio (ref <=5.0)
CHOLESTEROL: 140 mg/dL (ref 125–200)
HDL: 43 mg/dL (ref >=40)
LDL Cholesterol: 82 mg/dL (ref <130)
TRIGLYCERIDES: 77 mg/dL (ref <150)
VLDL: 15 mg/dL (ref <30)

## 2015-06-20 NOTE — Telephone Encounter (Signed)
Patient came in for some blood work Patient is requesting a cream for his anal itching Can you send this to the pharmacy for him thanks

## 2015-06-20 NOTE — Telephone Encounter (Signed)
I sent hydrocortisone cream

## 2015-06-21 ENCOUNTER — Telehealth: Payer: Self-pay

## 2015-06-21 NOTE — Telephone Encounter (Signed)
-----   Message from Ambrose FinlandValerie A Keck, NP sent at 06/21/2015 12:18 PM EDT ----- Cholesterol looks great. Kidneys are improving

## 2015-06-21 NOTE — Telephone Encounter (Signed)
Tried to contact patient  Patient not available Message left on voice mail to return

## 2015-06-22 ENCOUNTER — Other Ambulatory Visit: Payer: Self-pay | Admitting: Internal Medicine

## 2015-06-24 ENCOUNTER — Other Ambulatory Visit: Payer: Self-pay | Admitting: Internal Medicine

## 2015-06-27 ENCOUNTER — Other Ambulatory Visit: Payer: Self-pay | Admitting: Internal Medicine

## 2015-06-28 ENCOUNTER — Telehealth: Payer: Self-pay | Admitting: Internal Medicine

## 2015-06-28 ENCOUNTER — Telehealth: Payer: Self-pay

## 2015-06-28 NOTE — Telephone Encounter (Signed)
Pt. Came in today requesting her lab results. Pt. Also stated that he needed refill on a DM medication but he could not tell me the name. He stated it was 600 mg. Please f/u with pt.

## 2015-06-28 NOTE — Telephone Encounter (Signed)
Returned phone call to patient Patient is stating he has no refills on his medications RX was just filled on 06/16/15 And has refills

## 2015-07-13 ENCOUNTER — Other Ambulatory Visit: Payer: Self-pay | Admitting: Pharmacist

## 2015-07-13 DIAGNOSIS — E781 Pure hyperglyceridemia: Secondary | ICD-10-CM

## 2015-07-13 MED ORDER — ICOSAPENT ETHYL 1 G PO CAPS: 2.0000 g | ORAL_CAPSULE | Freq: Two times a day (BID) | ORAL | Status: DC

## 2015-10-16 ENCOUNTER — Other Ambulatory Visit: Payer: Self-pay | Admitting: Internal Medicine

## 2015-10-16 DIAGNOSIS — E038 Other specified hypothyroidism: Secondary | ICD-10-CM

## 2015-10-16 DIAGNOSIS — Z23 Encounter for immunization: Secondary | ICD-10-CM

## 2015-10-16 DIAGNOSIS — E119 Type 2 diabetes mellitus without complications: Secondary | ICD-10-CM

## 2015-10-16 DIAGNOSIS — I10 Essential (primary) hypertension: Secondary | ICD-10-CM

## 2015-10-17 NOTE — Telephone Encounter (Signed)
Rx request 

## 2015-11-07 ENCOUNTER — Telehealth: Payer: Self-pay | Admitting: Internal Medicine

## 2015-11-07 DIAGNOSIS — E781 Pure hyperglyceridemia: Secondary | ICD-10-CM

## 2015-11-07 DIAGNOSIS — E119 Type 2 diabetes mellitus without complications: Secondary | ICD-10-CM

## 2015-11-07 DIAGNOSIS — M25551 Pain in right hip: Secondary | ICD-10-CM

## 2015-11-07 DIAGNOSIS — Z23 Encounter for immunization: Secondary | ICD-10-CM

## 2015-11-07 DIAGNOSIS — I10 Essential (primary) hypertension: Secondary | ICD-10-CM

## 2015-11-07 DIAGNOSIS — E038 Other specified hypothyroidism: Secondary | ICD-10-CM

## 2015-11-07 MED ORDER — GABAPENTIN 300 MG PO CAPS: 300.0000 mg | ORAL_CAPSULE | Freq: Every day | ORAL | 0 refills | Status: DC

## 2015-11-07 MED ORDER — ICOSAPENT ETHYL 1 G PO CAPS: 2.0000 g | ORAL_CAPSULE | Freq: Two times a day (BID) | ORAL | 0 refills | Status: DC

## 2015-11-07 MED ORDER — LOSARTAN POTASSIUM-HCTZ 100-12.5 MG PO TABS: 1.0000 | ORAL_TABLET | Freq: Every day | ORAL | 0 refills | Status: DC

## 2015-11-07 MED ORDER — PIOGLITAZONE HCL 15 MG PO TABS: 15.0000 mg | ORAL_TABLET | Freq: Every day | ORAL | 0 refills | Status: DC

## 2015-11-07 MED ORDER — METOPROLOL SUCCINATE ER 50 MG PO TB24: 50.0000 mg | ORAL_TABLET | Freq: Every day | ORAL | 0 refills | Status: DC

## 2015-11-07 MED ORDER — GLIMEPIRIDE 2 MG PO TABS: 2.0000 mg | ORAL_TABLET | Freq: Two times a day (BID) | ORAL | 0 refills | Status: DC

## 2015-11-07 MED ORDER — LEVOTHYROXINE SODIUM 50 MCG PO TABS: 50.0000 ug | ORAL_TABLET | Freq: Every day | ORAL | 0 refills | Status: DC

## 2015-11-07 NOTE — Telephone Encounter (Signed)
Pt. Called requesting a refill on all his current medications. [facility] Please f/u

## 2015-11-07 NOTE — Telephone Encounter (Signed)
Chronic medications refilled - patient needs office visit for further refills.

## 2015-11-09 ENCOUNTER — Ambulatory Visit (HOSPITAL_COMMUNITY)
Admission: EM | Admit: 2015-11-09 | Discharge: 2015-11-09 | Discharge status: Against Medical Advice | Payer: Medicare Other

## 2015-11-09 ENCOUNTER — Ambulatory Visit: Payer: Medicare Other | Attending: Family Medicine | Admitting: Family Medicine

## 2015-11-09 ENCOUNTER — Encounter: Payer: Self-pay | Admitting: Family Medicine

## 2015-11-09 VITALS — BP 96/67 | HR 80 | Temp 97.9°F | Ht 64.5 in | Wt 166.8 lb

## 2015-11-09 DIAGNOSIS — I10 Essential (primary) hypertension: Secondary | ICD-10-CM | POA: Diagnosis not present

## 2015-11-09 DIAGNOSIS — E039 Hypothyroidism, unspecified: Secondary | ICD-10-CM | POA: Insufficient documentation

## 2015-11-09 DIAGNOSIS — M25562 Pain in left knee: Secondary | ICD-10-CM

## 2015-11-09 DIAGNOSIS — Z79899 Other long term (current) drug therapy: Secondary | ICD-10-CM | POA: Insufficient documentation

## 2015-11-09 DIAGNOSIS — Z9889 Other specified postprocedural states: Secondary | ICD-10-CM | POA: Insufficient documentation

## 2015-11-09 DIAGNOSIS — K219 Gastro-esophageal reflux disease without esophagitis: Secondary | ICD-10-CM | POA: Diagnosis not present

## 2015-11-09 DIAGNOSIS — E089 Diabetes mellitus due to underlying condition without complications: Secondary | ICD-10-CM | POA: Diagnosis not present

## 2015-11-09 DIAGNOSIS — I9589 Other hypotension: Secondary | ICD-10-CM

## 2015-11-09 DIAGNOSIS — E119 Type 2 diabetes mellitus without complications: Secondary | ICD-10-CM

## 2015-11-09 DIAGNOSIS — E038 Other specified hypothyroidism: Secondary | ICD-10-CM

## 2015-11-09 LAB — POCT GLYCOSYLATED HEMOGLOBIN (HGB A1C): HEMOGLOBIN A1C: 7.3

## 2015-11-09 LAB — GLUCOSE, POCT (MANUAL RESULT ENTRY): POC GLUCOSE: 177 mg/dL — AB (ref 70–99)

## 2015-11-09 MED ORDER — GLIMEPIRIDE 4 MG PO TABS: 4.0000 mg | ORAL_TABLET | Freq: Every day | ORAL | 1 refills | Status: DC

## 2015-11-09 MED ORDER — LEVOTHYROXINE SODIUM 50 MCG PO TABS: 50.0000 ug | ORAL_TABLET | Freq: Every day | ORAL | 0 refills | Status: DC

## 2015-11-09 MED ORDER — PANTOPRAZOLE SODIUM 20 MG PO TBEC: 20.0000 mg | DELAYED_RELEASE_TABLET | Freq: Every day | ORAL | 1 refills | Status: DC

## 2015-11-09 MED ORDER — PIOGLITAZONE HCL 15 MG PO TABS: 15.0000 mg | ORAL_TABLET | Freq: Every day | ORAL | 1 refills | Status: DC

## 2015-11-09 MED ORDER — HYDROCORTISONE 1 % EX CREA: 1.0000 "application " | TOPICAL_CREAM | Freq: Two times a day (BID) | CUTANEOUS | 1 refills | Status: AC

## 2015-11-09 MED ORDER — METFORMIN HCL 500 MG PO TABS: 500.0000 mg | ORAL_TABLET | Freq: Two times a day (BID) | ORAL | 1 refills | Status: DC

## 2015-11-09 MED ORDER — LOSARTAN POTASSIUM-HCTZ 100-12.5 MG PO TABS: 1.0000 | ORAL_TABLET | Freq: Every day | ORAL | 0 refills | Status: DC

## 2015-11-09 NOTE — Progress Notes (Signed)
Subjective:  Patient ID: Jose Brady, male    DOB: [birth year]  Age: 65 y.o. MRN: [id number]  CC: Diabetes and Leg Pain (left leg)   HPI Jose Brady is a 65 year old male with a history of type 2 diabetes mellitus (A1c 7.3 from today), hypertension, GERD, diabetic neuropathy, left knee osteoarthritis who comes into the clinic to establish care with me.  He complains of left knee pain and swelling worse with ambulation and is requesting a referral to his orthopedics-Dr. Eulah Pont who has given him left knee corticosteroid injections in the past which helped. MRI of the left knee from 07/2014 revealed large radial tear of the posterior half the medial meniscus, torn anterior cruciate ligament, tricompartmental spurring, small to moderate knee joint effusion with mild synovitis of any small Baker's cyst. Has also been dizzy for the last 3-4 days but denies syncope; denies tinnitus, nausea or vomiting.  He is concerned that his fasting sugars have been in the 133-154 range despite compliance with his medications. Up-to-date on eye exam which he had at [facility]. He takes Neurontin for diabetic neuropathy but complains of sedation with this and so he has stopped it.  Past Medical History:  Diagnosis Date  . Diabetes mellitus without complication (HCC)   . Hypertension   . Thyroid disease     Past Surgical History:  Procedure Laterality Date  . CHOLECYSTECTOMY       Outpatient Medications Prior to Visit  Medication Sig Dispense Refill  . glimepiride (AMARYL) 2 MG tablet Take 1 tablet (2 mg total) by mouth 2 (two) times daily. 60 tablet 0  . hydrocortisone cream 1 % Apply 1 application topically 2 (two) times daily. 30 g 1  . levothyroxine (SYNTHROID, LEVOTHROID) 50 MCG tablet Take 1 tablet (50 mcg total) by mouth daily before breakfast. 30 tablet 0  . losartan-hydrochlorothiazide (HYZAAR) 100-12.5 MG tablet Take 1 tablet by mouth daily. 30 tablet 0  .  metoprolol succinate (TOPROL-XL) 50 MG 24 hr tablet Take 1 tablet (50 mg total) by mouth daily. Take with or immediately following a meal. 30 tablet 0  . pantoprazole (PROTONIX) 20 MG tablet Take 1 tablet (20 mg total) by mouth daily. 90 tablet 1  . pioglitazone (ACTOS) 15 MG tablet Take 1 tablet (15 mg total) by mouth daily. 30 tablet 0  . aspirin 81 MG tablet Take 81 mg by mouth daily. Reported on 06/16/2015    . gabapentin (NEURONTIN) 300 MG capsule Take 1 capsule (300 mg total) by mouth at bedtime. (Patient not taking: Reported on 11/09/2015) 30 capsule 0  . Icosapent Ethyl 1 g CAPS Take 2 g by mouth 2 (two) times daily with a meal. (Patient not taking: Reported on 11/09/2015) 120 capsule 0   No facility-administered medications prior to visit.     ROS Review of Systems  Constitutional: Negative for activity change and appetite change.  HENT: Negative for sinus pressure and sore throat.   Eyes: Negative for visual disturbance.  Respiratory: Negative for cough, chest tightness and shortness of breath.   Cardiovascular: Negative for chest pain and leg swelling.  Gastrointestinal: Negative for abdominal distention, abdominal pain, constipation and diarrhea.  Endocrine: Negative.   Genitourinary: Negative for dysuria.  Musculoskeletal:       See history of present illness  Skin: Negative for rash.  Allergic/Immunologic: Negative.   Neurological: Negative for weakness, light-headedness and numbness.  Psychiatric/Behavioral: Negative for dysphoric mood and suicidal ideas.    Objective:  BP 96/67 (  BP Location: Right Arm, Patient Position: Sitting, Cuff Size: Large)   Pulse 80   Temp 97.9 F (36.6 C) (Oral)   Ht 5' 4.5" (1.638 m)   Wt 166 lb 12.8 oz (75.7 kg)   SpO2 97%   BMI 28.19 kg/m   BP/Weight 11/09/2015 06/16/2015 03/09/2015  Systolic BP 96 122 117  Diastolic BP 67 79 75  Wt. (Lbs) 166.8 165.4 -  BMI 28.19 29.31 -      Physical Exam  Constitutional: He is oriented to  person, place, and time. He appears well-developed and well-nourished.  Cardiovascular: Normal rate, normal heart sounds and intact distal pulses.   No murmur heard. Pulmonary/Chest: Effort normal and breath sounds normal. He has no wheezes. He has no rales. He exhibits no tenderness.  Abdominal: Soft. Bowel sounds are normal. He exhibits no distension and no mass. There is no tenderness.  Musculoskeletal: He exhibits tenderness ( tenderness on palpation of medial aspect of the left knee and on range of motion).  Neurological: He is alert and oriented to person, place, and time.   Lab Results  Component Value Date   HGBA1C 7.3 11/09/2015     Assessment & Plan:   1. Diabetes mellitus due to underlying condition without complication, unspecified long term insulin use status (HCC) A1c 7.3 which has trended up from 6.4 previously Metformin added to regimen - Glucose (CBG) - HgB A1c  2. Essential hypertension Controlled - losartan-hydrochlorothiazide (HYZAAR) 100-12.5 MG tablet; Take 1 tablet by mouth daily.  Dispense: 90 tablet; Refill: 0  3. Gastroesophageal reflux disease, esophagitis presence not specified Stable - pantoprazole (PROTONIX) 20 MG tablet; Take 1 tablet (20 mg total) by mouth daily.  Dispense: 90 tablet; Refill: 1  4. Type 2 diabetes mellitus without complication, without long-term current use of insulin (HCC) - pioglitazone (ACTOS) 15 MG tablet; Take 1 tablet (15 mg total) by mouth daily.  Dispense: 90 tablet; Refill: 1 - glimepiride (AMARYL) 4 MG tablet; Take 1 tablet (4 mg total) by mouth daily with breakfast.  Dispense: 90 tablet; Refill: 1  5. Other specified hypothyroidism - levothyroxine (SYNTHROID, LEVOTHROID) 50 MCG tablet; Take 1 tablet (50 mcg total) by mouth daily before breakfast.  Dispense: 90 tablet; Refill: 0 - TSH  6. Left knee pain Continue the use a knee sleeve - Ambulatory referral to Orthopedic Surgery  7. Other specified  hypotension Discontinue metipranolol due to symptomatic hypotension Reassess blood pressure at next visit   Meds ordered this encounter  Medications  . metFORMIN (GLUCOPHAGE) 500 MG tablet    Sig: Take 1 tablet (500 mg total) by mouth 2 (two) times daily with a meal.    Dispense:  180 tablet    Refill:  1  . pantoprazole (PROTONIX) 20 MG tablet    Sig: Take 1 tablet (20 mg total) by mouth daily.    Dispense:  90 tablet    Refill:  1  . pioglitazone (ACTOS) 15 MG tablet    Sig: Take 1 tablet (15 mg total) by mouth daily.    Dispense:  90 tablet    Refill:  1  . losartan-hydrochlorothiazide (HYZAAR) 100-12.5 MG tablet    Sig: Take 1 tablet by mouth daily.    Dispense:  90 tablet    Refill:  0    Must have office visit for refills  . levothyroxine (SYNTHROID, LEVOTHROID) 50 MCG tablet    Sig: Take 1 tablet (50 mcg total) by mouth daily before breakfast.    Dispense:  90 tablet    Refill:  0  . hydrocortisone cream 1 %    Sig: Apply 1 application topically 2 (two) times daily.    Dispense:  30 g    Refill:  1  . glimepiride (AMARYL) 4 MG tablet    Sig: Take 1 tablet (4 mg total) by mouth daily with breakfast.    Dispense:  90 tablet    Refill:  1    Follow-up: Return in about 1 month (around 12/10/2015) for Follow-up on hypotension.   Jaclyn ShaggyEnobong Amao MD

## 2015-11-09 NOTE — ED Notes (Signed)
According  To  Front  staff  Pt  Stated  Over  1  Hour  He  Was  Going  To leave  And  Come  Back in 5  mins     Waiting  Room  Checked  X  2      10  mins  Apart pt nowhere  To  Be  Found

## 2015-11-09 NOTE — ED Notes (Signed)
Pt advised.

## 2015-11-09 NOTE — Patient Instructions (Signed)

## 2015-11-09 NOTE — Progress Notes (Signed)
BP low for 3-4 days Dizzy Patient states he's "nervous" because his BS has been high at home

## 2015-11-10 LAB — TSH: TSH: 2.63 m[IU]/L (ref 0.40–4.50)

## 2015-11-11 ENCOUNTER — Telehealth: Payer: Self-pay | Admitting: Family Medicine

## 2015-11-11 NOTE — Telephone Encounter (Signed)
Pt. Called requesting a glucose meter and a blood pressure meter. Please f/u

## 2015-11-14 ENCOUNTER — Ambulatory Visit: Payer: Medicare Other | Admitting: Family Medicine

## 2015-11-14 MED ORDER — GLUCOSE BLOOD VI STRP: ORAL_STRIP | 12 refills | Status: DC

## 2015-11-14 MED ORDER — ACCU-CHEK AVIVA DEVI: 0 refills | Status: DC

## 2015-11-14 MED ORDER — ACCU-CHEK SOFTCLIX LANCET DEV MISC: 5 refills | Status: DC

## 2015-11-14 MED ORDER — BLOOD PRESSURE KIT: 1.0000 | PACK | Freq: Every day | 0 refills | Status: DC

## 2015-11-14 NOTE — Telephone Encounter (Signed)
Done

## 2015-11-15 ENCOUNTER — Other Ambulatory Visit: Payer: Self-pay | Admitting: Pharmacist

## 2015-11-15 MED ORDER — BLOOD PRESSURE KIT: PACK | 0 refills | Status: AC

## 2015-11-15 MED ORDER — ACCU-CHEK SOFTCLIX LANCET DEV MISC: 5 refills | Status: AC

## 2015-11-15 MED ORDER — GLUCOSE BLOOD VI STRP: ORAL_STRIP | 12 refills | Status: AC

## 2015-11-15 MED ORDER — ACCU-CHEK AVIVA DEVI: 0 refills | Status: AC

## 2015-11-16 ENCOUNTER — Telehealth: Payer: Self-pay

## 2015-11-16 NOTE — Telephone Encounter (Signed)
Writer called through [employer] to inform patient that his thyroid test was normal and he is to continue on the same dose of levothyroxine 50 mcg.  Patient stated understanding.

## 2015-11-16 NOTE — Telephone Encounter (Signed)
-----   Message from Jaclyn ShaggyEnobong Amao, MD sent at 11/10/2015  1:50 PM EDT ----- Please inform the patient that labs are normal. Thank you.

## 2015-11-22 NOTE — Telephone Encounter (Signed)
Call placed to [employer] [phone], interpreter ID [id number] assisted with call translation. Return call to patient [phone], patient advised Per Dr. Venetia NightAmao: request completed for glucose/blood pressure monitor.

## 2015-11-30 ENCOUNTER — Encounter (HOSPITAL_COMMUNITY): Payer: Self-pay | Admitting: Student-PharmD

## 2015-12-05 ENCOUNTER — Encounter: Payer: Self-pay | Admitting: Family Medicine

## 2015-12-05 ENCOUNTER — Ambulatory Visit: Payer: Medicare Other | Attending: Family Medicine | Admitting: Family Medicine

## 2015-12-05 VITALS — BP 132/87 | HR 85 | Temp 98.3°F | Resp 16 | Wt 167.0 lb

## 2015-12-05 DIAGNOSIS — E089 Diabetes mellitus due to underlying condition without complications: Secondary | ICD-10-CM | POA: Diagnosis not present

## 2015-12-05 DIAGNOSIS — M545 Low back pain, unspecified: Secondary | ICD-10-CM

## 2015-12-05 DIAGNOSIS — Z23 Encounter for immunization: Secondary | ICD-10-CM

## 2015-12-05 LAB — GLUCOSE, POCT (MANUAL RESULT ENTRY): POC Glucose: 158 mg/dl — AB (ref 70–99)

## 2015-12-05 MED ORDER — TRAMADOL HCL 50 MG PO TABS: 50.0000 mg | ORAL_TABLET | Freq: Two times a day (BID) | ORAL | 0 refills | Status: DC | PRN

## 2015-12-05 NOTE — Patient Instructions (Signed)

## 2015-12-05 NOTE — Progress Notes (Signed)
Subjective:  Patient ID: Jose Brady, male    DOB: [birth year]  Age: 65 y.o. MRN: [id number]  CC: Hip Pain and Hypotension   HPI [name] is a 65 year old male with a history of hypothyroidism, type 2 diabetes mellitus (A1c 7.3) who presents today for follow-up of hypotension as his blood pressure was on the low side at his last office visit but is normal today.  He complains of a 20 day history of low back pain and right hip pain which did not radiate down his extremities; pain is worse at night and prevents him from sleeping; described as moderate to severe. He denies numbness in his lower legs or weakness and has no history of trauma or fall. No currently taking any analgesics.  Past Medical History:  Diagnosis Date  . Diabetes mellitus without complication ([place])   . Hypertension   . Thyroid disease     Past Surgical History:  Procedure Laterality Date  . CHOLECYSTECTOMY      Allergies  Allergen Reactions  . Penicillins Hives     Outpatient Medications Prior to Visit  Medication Sig Dispense Refill  . aspirin 81 MG tablet Take 81 mg by mouth daily. Reported on 06/16/2015    . Blood Glucose Monitoring Suppl (ACCU-CHEK AVIVA) device Use as instructed  3 times daily. 1 each 0  . Blood Pressure KIT Use as directed 1 each 0  . gabapentin (NEURONTIN) 300 MG capsule Take 1 capsule (300 mg total) by mouth at bedtime. (Patient not taking: Reported on 11/09/2015) 30 capsule 0  . glimepiride (AMARYL) 4 MG tablet Take 1 tablet (4 mg total) by mouth daily with breakfast. 90 tablet 1  . glucose blood (ACCU-CHEK AVIVA) test strip Used 3 times daily before meals 100 each 12  . hydrocortisone cream 1 % Apply 1 application topically 2 (two) times daily. 30 g 1  . Lancet Devices (ACCU-CHEK SOFTCLIX) lancets Use as 3 times daily. 1 each 5  . levothyroxine (SYNTHROID, LEVOTHROID) 50 MCG tablet Take 1 tablet (50 mcg total) by mouth daily before breakfast. 90  tablet 0  . losartan-hydrochlorothiazide (HYZAAR) 100-12.5 MG tablet Take 1 tablet by mouth daily. 90 tablet 0  . metFORMIN (GLUCOPHAGE) 500 MG tablet Take 1 tablet (500 mg total) by mouth 2 (two) times daily with a meal. 180 tablet 1  . Omega-3 Fatty Acids (FISH OIL) 1000 MG CAPS Take 1,000 mg by mouth 2 (two) times daily.    . pantoprazole (PROTONIX) 20 MG tablet Take 1 tablet (20 mg total) by mouth daily. 90 tablet 1  . pioglitazone (ACTOS) 15 MG tablet Take 1 tablet (15 mg total) by mouth daily. 90 tablet 1   No facility-administered medications prior to visit.     ROS Review of Systems  Constitutional: Negative for activity change and appetite change.  HENT: Negative for sinus pressure and sore throat.   Respiratory: Negative for chest tightness, shortness of breath and wheezing.   Cardiovascular: Negative for chest pain and palpitations.  Gastrointestinal: Negative for abdominal distention, abdominal pain and constipation.  Genitourinary: Negative.   Musculoskeletal:       See history of present illness  Psychiatric/Behavioral: Positive for sleep disturbance. Negative for behavioral problems and dysphoric mood.    Objective:  BP 132/87 (BP Location: Right Arm, Patient Position: Sitting, Cuff Size: Normal)   Pulse 85   Temp 98.3 F (36.8 C) (Oral)   Resp 16   Wt 167 lb (75.8 kg)   SpO2  98%   BMI 28.22 kg/m   BP/Weight 12/05/2015 11/09/2015 3/97/9536  Systolic BP 922 96 300  Diastolic BP 87 67 79  Wt. (Lbs) 167 166.8 165.4  BMI 28.22 28.19 29.31      Physical Exam  Constitutional: He is oriented to person, place, and time. He appears well-developed and well-nourished.  Cardiovascular: Normal rate, normal heart sounds and intact distal pulses.   No murmur heard. Pulmonary/Chest: Effort normal and breath sounds normal. He has no wheezes. He has no rales. He exhibits no tenderness.  Abdominal: Soft. Bowel sounds are normal. He exhibits no distension and no mass. There  is no tenderness.  Musculoskeletal: He exhibits tenderness (tenderness on palpation of lumbar region; negative straight leg raise bilaterally).  Neurological: He is alert and oriented to person, place, and time.     Assessment & Plan:   1. Diabetes mellitus due to underlying condition without complication, unspecified long term insulin use status (HCC) Controlled with A1c of 7.3 - Glucose (CBG)  2. Midline low back pain without sciatica Cannot exclude underlying osteoarthritis We'll hold off on NSAIDs due to slightly abnormal renal function Apply heat pad Placed on tramadol  3. Encounter for immunization - Flu Vaccine QUAD 36+ mos IM   Meds ordered this encounter  Medications  . traMADol (ULTRAM) 50 MG tablet    Sig: Take 1 tablet (50 mg total) by mouth every 12 (twelve) hours as needed.    Dispense:  30 tablet    Refill:  0    Follow-up: Return in about 3 months (around 03/05/2016) for follow up on Diabetes mellitus.   This note has been created with Surveyor, quantity. Any transcriptional errors are unintentional.    Arnoldo Morale MD

## 2015-12-05 NOTE — Progress Notes (Signed)
  F/u Bp. Rt hip/lower back pain(rt)  pain 20 days.Insomnia x 15 days

## 2015-12-07 ENCOUNTER — Telehealth: Payer: Self-pay | Admitting: Family Medicine

## 2015-12-07 NOTE — Telephone Encounter (Signed)
Patient called states he has started tramadol and now pain has gone to  R ankle and was unable to walk. Pt states pain is worse and was unable to sleep. Please f/up

## 2015-12-09 ENCOUNTER — Telehealth: Payer: Self-pay

## 2015-12-09 NOTE — Telephone Encounter (Signed)
Writer has called the patient back several times through [employer].  Apparently their system is down and they are having a difficult time dialing out- Clinical research associatewriter has been on hold three different times up to 10 minutes then has been disconnected.  Writer has no other way of communicating with patient.

## 2015-12-14 NOTE — Telephone Encounter (Signed)
Call place to [facility] [employer] assistance received from rep ID [id number].  [facility] left [facility]   When patient calls please advise per: Dr. Venetia NightAmao It is unlikely that tramadol is causing his ankle pain. He can hold off on tramadol and take Tylenol Extra Strength as I am holding off on NSAIDs due to his abnormal renal function

## 2015-12-14 NOTE — Telephone Encounter (Signed)
It is unlikely that tramadol is causing his ankle pain. He can hold off on tramadol and take Tylenol Extra Strength as I am holding off on NSAIDs due to his abnormal renal function.

## 2016-03-08 ENCOUNTER — Ambulatory Visit: Payer: Medicare Other | Admitting: Family Medicine

## 2016-03-30 ENCOUNTER — Encounter: Payer: Self-pay | Admitting: Family Medicine

## 2016-03-30 ENCOUNTER — Ambulatory Visit: Payer: Medicare Other | Attending: Family Medicine | Admitting: Family Medicine

## 2016-03-30 VITALS — BP 134/75 | HR 115 | Temp 97.8°F | Ht 64.0 in | Wt 167.8 lb

## 2016-03-30 DIAGNOSIS — K219 Gastro-esophageal reflux disease without esophagitis: Secondary | ICD-10-CM

## 2016-03-30 DIAGNOSIS — E038 Other specified hypothyroidism: Secondary | ICD-10-CM | POA: Diagnosis not present

## 2016-03-30 DIAGNOSIS — M545 Low back pain, unspecified: Secondary | ICD-10-CM

## 2016-03-30 DIAGNOSIS — Z7982 Long term (current) use of aspirin: Secondary | ICD-10-CM | POA: Insufficient documentation

## 2016-03-30 DIAGNOSIS — Z880 Allergy status to penicillin: Secondary | ICD-10-CM | POA: Insufficient documentation

## 2016-03-30 DIAGNOSIS — Z7984 Long term (current) use of oral hypoglycemic drugs: Secondary | ICD-10-CM | POA: Insufficient documentation

## 2016-03-30 DIAGNOSIS — Z9049 Acquired absence of other specified parts of digestive tract: Secondary | ICD-10-CM | POA: Diagnosis not present

## 2016-03-30 DIAGNOSIS — Z1159 Encounter for screening for other viral diseases: Secondary | ICD-10-CM

## 2016-03-30 DIAGNOSIS — E1149 Type 2 diabetes mellitus with other diabetic neurological complication: Secondary | ICD-10-CM | POA: Diagnosis not present

## 2016-03-30 DIAGNOSIS — M25551 Pain in right hip: Secondary | ICD-10-CM

## 2016-03-30 DIAGNOSIS — E119 Type 2 diabetes mellitus without complications: Secondary | ICD-10-CM | POA: Diagnosis present

## 2016-03-30 DIAGNOSIS — I10 Essential (primary) hypertension: Secondary | ICD-10-CM

## 2016-03-30 DIAGNOSIS — Z1211 Encounter for screening for malignant neoplasm of colon: Secondary | ICD-10-CM

## 2016-03-30 DIAGNOSIS — G8929 Other chronic pain: Secondary | ICD-10-CM

## 2016-03-30 DIAGNOSIS — E1140 Type 2 diabetes mellitus with diabetic neuropathy, unspecified: Secondary | ICD-10-CM | POA: Insufficient documentation

## 2016-03-30 LAB — POCT GLYCOSYLATED HEMOGLOBIN (HGB A1C): HEMOGLOBIN A1C: 6.6

## 2016-03-30 LAB — GLUCOSE, POCT (MANUAL RESULT ENTRY): POC GLUCOSE: 226 mg/dL — AB (ref 70–99)

## 2016-03-30 MED ORDER — PANTOPRAZOLE SODIUM 20 MG PO TBEC: 20.0000 mg | DELAYED_RELEASE_TABLET | Freq: Every day | ORAL | 1 refills | Status: AC

## 2016-03-30 MED ORDER — LEVOTHYROXINE SODIUM 50 MCG PO TABS: 50.0000 ug | ORAL_TABLET | Freq: Every day | ORAL | 0 refills | Status: AC

## 2016-03-30 MED ORDER — PIOGLITAZONE HCL 15 MG PO TABS: 15.0000 mg | ORAL_TABLET | Freq: Every day | ORAL | 1 refills | Status: AC

## 2016-03-30 MED ORDER — LOSARTAN POTASSIUM-HCTZ 100-12.5 MG PO TABS: 1.0000 | ORAL_TABLET | Freq: Every day | ORAL | 0 refills | Status: AC

## 2016-03-30 MED ORDER — GABAPENTIN 300 MG PO CAPS: 300.0000 mg | ORAL_CAPSULE | Freq: Every day | ORAL | 1 refills | Status: AC

## 2016-03-30 MED ORDER — METHOCARBAMOL 750 MG PO TABS: 750.0000 mg | ORAL_TABLET | Freq: Two times a day (BID) | ORAL | 1 refills | Status: DC | PRN

## 2016-03-30 MED ORDER — GLIMEPIRIDE 4 MG PO TABS: 4.0000 mg | ORAL_TABLET | Freq: Every day | ORAL | 1 refills | Status: DC

## 2016-03-30 MED ORDER — METFORMIN HCL 500 MG PO TABS: 500.0000 mg | ORAL_TABLET | Freq: Two times a day (BID) | ORAL | 1 refills | Status: AC

## 2016-03-30 MED ORDER — DICLOFENAC SODIUM 1 % TD GEL: 4.0000 g | Freq: Two times a day (BID) | TRANSDERMAL | 3 refills | Status: AC | PRN

## 2016-03-30 NOTE — Progress Notes (Addendum)
Subjective:  Patient ID: Jose Brady, male    DOB: [birth year]  Age: 67 y.o. MRN: [id number]  CC: Diabetes; Back Pain (lower); Leg Pain (right); and Heartburn (worse last 2 months)   HPI Jose Brady is a 66 year old male with a history of type 2 diabetes mellitus (A1c 6.6), hypertension, hypothyroidism, GERD who presents today for a follow-up visit.  He has been compliant with his medications, diabetic diet but not exercise. Denies hypoglycemia, visual symptoms and endorses occasional numbness in his feet. He has not been seen by podiatrist.  He complains of heartburn despite taking his PPI and this is not restricted to certain kinds of foods. Denies nausea, vomiting, constipation or diarrhea.  Complains of low back pain on both sides of his spine and also right hip pain. Back pain prevented him from getting up this morning he says. I had previously prescribed tramadol for pain which he complains made him dizzy. States he will be traveling to [place] in about 3 weeks and will be referred to weeks.   Past Medical History:  Diagnosis Date  . Diabetes mellitus without complication ([place])   . Hypertension   . Thyroid disease     Past Surgical History:  Procedure Laterality Date  . CHOLECYSTECTOMY      Allergies  Allergen Reactions  . Penicillins Hives     Outpatient Medications Prior to Visit  Medication Sig Dispense Refill  . Blood Glucose Monitoring Suppl (ACCU-CHEK AVIVA) device Use as instructed  3 times daily. 1 each 0  . Blood Pressure KIT Use as directed 1 each 0  . glucose blood (ACCU-CHEK AVIVA) test strip Used 3 times daily before meals 100 each 12  . hydrocortisone cream 1 % Apply 1 application topically 2 (two) times daily. 30 g 1  . Lancet Devices (ACCU-CHEK SOFTCLIX) lancets Use as 3 times daily. 1 each 5  . Omega-3 Fatty Acids (FISH OIL) 1000 MG CAPS Take 1,000 mg by mouth 2 (two) times daily.    Marland Kitchen glimepiride (AMARYL) 4 MG tablet  Take 1 tablet (4 mg total) by mouth daily with breakfast. 90 tablet 1  . levothyroxine (SYNTHROID, LEVOTHROID) 50 MCG tablet Take 1 tablet (50 mcg total) by mouth daily before breakfast. 90 tablet 0  . losartan-hydrochlorothiazide (HYZAAR) 100-12.5 MG tablet Take 1 tablet by mouth daily. 90 tablet 0  . metFORMIN (GLUCOPHAGE) 500 MG tablet Take 1 tablet (500 mg total) by mouth 2 (two) times daily with a meal. 180 tablet 1  . pantoprazole (PROTONIX) 20 MG tablet Take 1 tablet (20 mg total) by mouth daily. 90 tablet 1  . pioglitazone (ACTOS) 15 MG tablet Take 1 tablet (15 mg total) by mouth daily. 90 tablet 1  . aspirin 81 MG tablet Take 81 mg by mouth daily. Reported on 06/16/2015    . gabapentin (NEURONTIN) 300 MG capsule Take 1 capsule (300 mg total) by mouth at bedtime. (Patient not taking: Reported on 03/30/2016) 30 capsule 0  . traMADol (ULTRAM) 50 MG tablet Take 1 tablet (50 mg total) by mouth every 12 (twelve) hours as needed. (Patient not taking: Reported on 03/30/2016) 30 tablet 0   No facility-administered medications prior to visit.     ROS Review of Systems  Constitutional: Negative for activity change and appetite change.  HENT: Negative for sinus pressure and sore throat.   Eyes: Negative for visual disturbance.  Respiratory: Negative for cough, chest tightness and shortness of breath.   Cardiovascular: Negative for chest pain and  leg swelling.  Gastrointestinal: Negative for abdominal distention, abdominal pain, constipation and diarrhea.       Heartburn  Endocrine: Negative.   Genitourinary: Negative for dysuria.  Musculoskeletal:       See hpi  Skin: Negative for rash.  Allergic/Immunologic: Negative.   Neurological: Positive for numbness. Negative for weakness and light-headedness.  Psychiatric/Behavioral: Negative for dysphoric mood and suicidal ideas.    Objective:  BP 134/75 (BP Location: Right Arm, Patient Position: Sitting, Cuff Size: Small)   Pulse (!) 115   Temp  97.8 F (36.6 C) (Oral)   Ht 5' 4"  (1.626 m)   Wt 167 lb 12.8 oz (76.1 kg)   SpO2 97%   BMI 28.80 kg/m   BP/Weight 03/30/2016 12/05/2015 6/81/1572  Systolic BP 620 355 96  Diastolic BP 75 87 67  Wt. (Lbs) 167.8 167 166.8  BMI 28.8 28.22 28.19      Physical Exam  Constitutional: He is oriented to person, place, and time. He appears well-developed and well-nourished.  Cardiovascular: Normal heart sounds and intact distal pulses.  Tachycardia present.   No murmur heard. Pulmonary/Chest: Effort normal and breath sounds normal. He has no wheezes. He has no rales. He exhibits no tenderness.  Abdominal: Soft. Bowel sounds are normal. He exhibits no distension and no mass. There is no tenderness.  Musculoskeletal: He exhibits tenderness (slight tenderness on range of motion of right hip and palpation of bilateral lumbar spine).  Neurological: He is alert and oriented to person, place, and time.    Lab Results  Component Value Date   HGBA1C 6.6 03/30/2016    CMP Latest Ref Rng & Units 06/20/2015 03/09/2015 12/15/2014  Glucose 65 - 99 mg/dL 99 76 123(H)  BUN 7 - 25 mg/dL 21 28(H) 20  Creatinine 0.70 - 1.25 mg/dL 1.40(H) 1.97(H) 1.41(H)  Sodium 135 - 146 mmol/L 139 137 132(L)  Potassium 3.5 - 5.3 mmol/L 4.8 4.7 4.7  Chloride 98 - 110 mmol/L 104 107 100  CO2 20 - 31 mmol/L 23 22 23   Calcium 8.6 - 10.3 mg/dL 8.8 8.8(L) 9.0  Total Protein 6.5 - 8.1 g/dL - 6.6 -  Total Bilirubin 0.3 - 1.2 mg/dL - 0.8 -  Alkaline Phos 38 - 126 U/L - 65 -  AST 15 - 41 U/L - 19 -  ALT 17 - 63 U/L - 18 -    Lipid Panel     Component Value Date/Time   CHOL 140 06/20/2015 1048   TRIG 77 06/20/2015 1048   HDL 43 06/20/2015 1048   CHOLHDL 3.3 06/20/2015 1048   VLDL 15 06/20/2015 1048   LDLCALC 82 06/20/2015 1048     Assessment & Plan:   1. Right hip pain, back pain Likely underlying osteoarthritis Placed on Robaxin Advised to take only at bedtime if sedation or dizziness noticed. - gabapentin  (NEURONTIN) 300 MG capsule; Take 1 capsule (300 mg total) by mouth at bedtime.  Dispense: 90 capsule; Refill: 1 - diclofenac sodium (VOLTAREN) 1 % GEL; Apply 4 g topically 2 (two) times daily as needed.  Dispense: 100 g; Refill: 3  2. Type 2 diabetes mellitus with other neurologic complication, without long-term current use of insulin (HCC) Controlled with A1c of 6.6 If diabetes remains controlled we will work on tapering down the dose of his medications - Glucose (CBG) - HgB A1c - glimepiride (AMARYL) 4 MG tablet; Take 1 tablet (4 mg total) by mouth daily with breakfast.  Dispense: 90 tablet; Refill: 1 - Ambulatory  referral to Podiatry - Lipid Panel w/reflex Direct LDL; Future - COMPLETE METABOLIC PANEL WITH GFR; Future - Microalbumin / creatinine urine ratio; Future - metFORMIN (GLUCOPHAGE) 500 MG tablet; Take 1 tablet (500 mg total) by mouth 2 (two) times daily with a meal.  Dispense: 180 tablet; Refill: 1 - pioglitazone (ACTOS) 15 MG tablet; Take 1 tablet (15 mg total) by mouth daily.  Dispense: 90 tablet; Refill: 1  3. Other specified hypothyroidism - TSH; Future - levothyroxine (SYNTHROID, LEVOTHROID) 50 MCG tablet; Take 1 tablet (50 mcg total) by mouth daily before breakfast.  Dispense: 90 tablet; Refill: 0  4. Essential hypertension Controlled - losartan-hydrochlorothiazide (HYZAAR) 100-12.5 MG tablet; Take 1 tablet by mouth daily.  Dispense: 90 tablet; Refill: 0  5. Gastroesophageal reflux disease, esophagitis presence not specified Uncontrolled Advised to take PPI first day in the morning before meals Avoid recumbency up to 2 hours after meals - pantoprazole (PROTONIX) 20 MG tablet; Take 1 tablet (20 mg total) by mouth daily.  Dispense: 90 tablet; Refill: 1  6. Special screening for malignant neoplasms, colon - Ambulatory referral to Gastroenterology  7. Need for hepatitis C screening test - Hepatitis C antibody, reflex; Future   Meds ordered this encounter    Medications  . gabapentin (NEURONTIN) 300 MG capsule    Sig: Take 1 capsule (300 mg total) by mouth at bedtime.    Dispense:  90 capsule    Refill:  1  . glimepiride (AMARYL) 4 MG tablet    Sig: Take 1 tablet (4 mg total) by mouth daily with breakfast.    Dispense:  90 tablet    Refill:  1  . methocarbamol (ROBAXIN-750) 750 MG tablet    Sig: Take 1 tablet (750 mg total) by mouth 2 (two) times daily as needed for muscle spasms.    Dispense:  60 tablet    Refill:  1  . diclofenac sodium (VOLTAREN) 1 % GEL    Sig: Apply 4 g topically 2 (two) times daily as needed.    Dispense:  100 g    Refill:  3  . levothyroxine (SYNTHROID, LEVOTHROID) 50 MCG tablet    Sig: Take 1 tablet (50 mcg total) by mouth daily before breakfast.    Dispense:  90 tablet    Refill:  0  . losartan-hydrochlorothiazide (HYZAAR) 100-12.5 MG tablet    Sig: Take 1 tablet by mouth daily.    Dispense:  90 tablet    Refill:  0    Must have office visit for refills  . metFORMIN (GLUCOPHAGE) 500 MG tablet    Sig: Take 1 tablet (500 mg total) by mouth 2 (two) times daily with a meal.    Dispense:  180 tablet    Refill:  1  . pantoprazole (PROTONIX) 20 MG tablet    Sig: Take 1 tablet (20 mg total) by mouth daily.    Dispense:  90 tablet    Refill:  1  . pioglitazone (ACTOS) 15 MG tablet    Sig: Take 1 tablet (15 mg total) by mouth daily.    Dispense:  90 tablet    Refill:  1    Follow-up: Return in about 3 months (around 06/28/2016) for Follow-up on diabetes mellitus.   Arnoldo Morale MD

## 2016-03-30 NOTE — Patient Instructions (Signed)
Gastroesophageal Reflux Disease, Adult Normally, food travels down the esophagus and stays in the stomach to be digested. However, when a person has gastroesophageal reflux disease (GERD), food and stomach acid move back up into the esophagus. When this happens, the esophagus becomes sore and inflamed. Over time, GERD can create small holes (ulcers) in the lining of the esophagus. What are the causes? This condition is caused by a problem with the muscle between the esophagus and the stomach (lower esophageal sphincter, or LES). Normally, the LES muscle closes after food passes through the esophagus to the stomach. When the LES is weakened or abnormal, it does not close properly, and that allows food and stomach acid to go back up into the esophagus. The LES can be weakened by certain dietary substances, medicines, and medical conditions, including:  Tobacco use.  Pregnancy.  Having a hiatal hernia.  Heavy alcohol use.  Certain foods and beverages, such as coffee, chocolate, onions, and peppermint.  What increases the risk? This condition is more likely to develop in:  People who have an increased body weight.  People who have connective tissue disorders.  People who use NSAID medicines.  What are the signs or symptoms? Symptoms of this condition include:  Heartburn.  Difficult or painful swallowing.  The feeling of having a lump in the throat.  Abitter taste in the mouth.  Bad breath.  Having a large amount of saliva.  Having an upset or bloated stomach.  Belching.  Chest pain.  Shortness of breath or wheezing.  Ongoing (chronic) cough or a night-time cough.  Wearing away of tooth enamel.  Weight loss.  Different conditions can cause chest pain. Make sure to see your health care provider if you experience chest pain. How is this diagnosed? Your health care provider will take a medical history and perform a physical exam. To determine if you have mild or severe  GERD, your health care provider may also monitor how you respond to treatment. You may also have other tests, including:  An endoscopy toexamine your stomach and esophagus with a small camera.  A test thatmeasures the acidity level in your esophagus.  A test thatmeasures how much pressure is on your esophagus.  A barium swallow or modified barium swallow to show the shape, size, and functioning of your esophagus.  How is this treated? The goal of treatment is to help relieve your symptoms and to prevent complications. Treatment for this condition may vary depending on how severe your symptoms are. Your health care provider may recommend:  Changes to your diet.  Medicine.  Surgery.  Follow these instructions at home: Diet  Follow a diet as recommended by your health care provider. This may involve avoiding foods and drinks such as: ? Coffee and tea (with or without caffeine). ? Drinks that containalcohol. ? Energy drinks and sports drinks. ? Carbonated drinks or sodas. ? Chocolate and cocoa. ? Peppermint and mint flavorings. ? Garlic and onions. ? Horseradish. ? Spicy and acidic foods, including peppers, chili powder, curry powder, vinegar, hot sauces, and barbecue sauce. ? Citrus fruit juices and citrus fruits, such as oranges, lemons, and limes. ? Tomato-based foods, such as red sauce, chili, salsa, and pizza with red sauce. ? Fried and fatty foods, such as donuts, french fries, potato chips, and high-fat dressings. ? High-fat meats, such as hot dogs and fatty cuts of red and white meats, such as rib eye steak, sausage, ham, and bacon. ? High-fat dairy items, such as whole milk,   butter, and cream cheese.  Eat small, frequent meals instead of large meals.  Avoid drinking large amounts of liquid with your meals.  Avoid eating meals during the 2-3 hours before bedtime.  Avoid lying down right after you eat.  Do not exercise right after you eat. General  instructions  Pay attention to any changes in your symptoms.  Take over-the-counter and prescription medicines only as told by your health care provider. Do not take aspirin, ibuprofen, or other NSAIDs unless your health care provider told you to do so.  Do not use any tobacco products, including cigarettes, chewing tobacco, and e-cigarettes. If you need help quitting, ask your health care provider.  Wear loose-fitting clothing. Do not wear anything tight around your waist that causes pressure on your abdomen.  Raise (elevate) the head of your bed 6 inches (15cm).  Try to reduce your stress, such as with yoga or meditation. If you need help reducing stress, ask your health care provider.  If you are overweight, reduce your weight to an amount that is healthy for you. Ask your health care provider for guidance about a safe weight loss goal.  Keep all follow-up visits as told by your health care provider. This is important. Contact a health care provider if:  You have new symptoms.  You have unexplained weight loss.  You have difficulty swallowing, or it hurts to swallow.  You have wheezing or a persistent cough.  Your symptoms do not improve with treatment.  You have a hoarse voice. Get help right away if:  You have pain in your arms, neck, jaw, teeth, or back.  You feel sweaty, dizzy, or light-headed.  You have chest pain or shortness of breath.  You vomit and your vomit looks like blood or coffee grounds.  You faint.  Your stool is bloody or black.  You cannot swallow, drink, or eat. This information is not intended to replace advice given to you by your health care provider. Make sure you discuss any questions you have with your health care provider. Document Released: 12/20/2004 Document Revised: 08/10/2015 Document Reviewed: 07/07/2014 Elsevier Interactive Patient Education  2017 [employer]  

## 2016-04-02 ENCOUNTER — Ambulatory Visit: Payer: Medicare Other | Attending: Family Medicine

## 2016-04-02 DIAGNOSIS — E038 Other specified hypothyroidism: Secondary | ICD-10-CM | POA: Insufficient documentation

## 2016-04-02 DIAGNOSIS — E1149 Type 2 diabetes mellitus with other diabetic neurological complication: Secondary | ICD-10-CM | POA: Diagnosis present

## 2016-04-02 DIAGNOSIS — Z1159 Encounter for screening for other viral diseases: Secondary | ICD-10-CM | POA: Diagnosis not present

## 2016-04-02 LAB — COMPLETE METABOLIC PANEL WITH GFR
ALT: 21 U/L (ref 9–46)
AST: 19 U/L (ref 10–35)
Albumin: 4.6 g/dL (ref 3.6–5.1)
Alkaline Phosphatase: 72 U/L (ref 40–115)
BILIRUBIN TOTAL: 1 mg/dL (ref 0.2–1.2)
BUN: 14 mg/dL (ref 7–25)
CHLORIDE: 100 mmol/L (ref 98–110)
CO2: 27 mmol/L (ref 20–31)
Calcium: 9.4 mg/dL (ref 8.6–10.3)
Creat: 1.43 mg/dL — ABNORMAL HIGH (ref 0.70–1.25)
GFR, EST AFRICAN AMERICAN: 58 mL/min — AB (ref >=60)
GFR, EST NON AFRICAN AMERICAN: 50 mL/min — AB (ref >=60)
GLUCOSE: 138 mg/dL — AB (ref 65–99)
POTASSIUM: 4.2 mmol/L (ref 3.5–5.3)
SODIUM: 138 mmol/L (ref 135–146)
Total Protein: 7.4 g/dL (ref 6.1–8.1)

## 2016-04-02 LAB — LIPID PANEL W/REFLEX DIRECT LDL
CHOL/HDL RATIO: 4.1 ratio (ref <5.0)
Cholesterol: 182 mg/dL (ref <200)
HDL: 44 mg/dL (ref >40)
LDL-CHOLESTEROL: 106 mg/dL — AB
NON-HDL CHOLESTEROL (CALC): 138 mg/dL — AB (ref <130)
TRIGLYCERIDES: 198 mg/dL — AB (ref <150)

## 2016-04-02 LAB — TSH: TSH: 3.67 mIU/L (ref 0.40–4.50)

## 2016-04-02 NOTE — Progress Notes (Signed)
Patient here for lab visit only 

## 2016-04-03 LAB — HEPATITIS C ANTIBODY: HCV AB: NEGATIVE

## 2016-04-03 LAB — MICROALBUMIN / CREATININE URINE RATIO
CREATININE, URINE: 226 mg/dL (ref 20–370)
MICROALB UR: 0.7 mg/dL
MICROALB/CREAT RATIO: 3 ug/mg{creat} (ref <30)

## 2016-04-05 ENCOUNTER — Other Ambulatory Visit: Payer: Self-pay | Admitting: Family Medicine

## 2016-04-05 MED ORDER — TIZANIDINE HCL 4 MG PO TABS: 4.0000 mg | ORAL_TABLET | Freq: Three times a day (TID) | ORAL | 1 refills | Status: AC | PRN

## 2016-04-13 IMAGING — CR DG HIP (WITH OR WITHOUT PELVIS) 2-3V*R*
2 series · 2 of 2 positions shown · non-contrast
Comparison: lumbar spine radiographs 06/23/2014 and 01/08/2013.

CLINICAL DATA: Chronic low back pain extending into the right hip
for 1 or 2 years. Intermittent numbness and tingling in the right
foot. No acute injury. Initial encounter.

EXAM:
RIGHT HIP (WITH PELVIS) 2-3 VIEWS

[w pelvis * (1 of 2)]
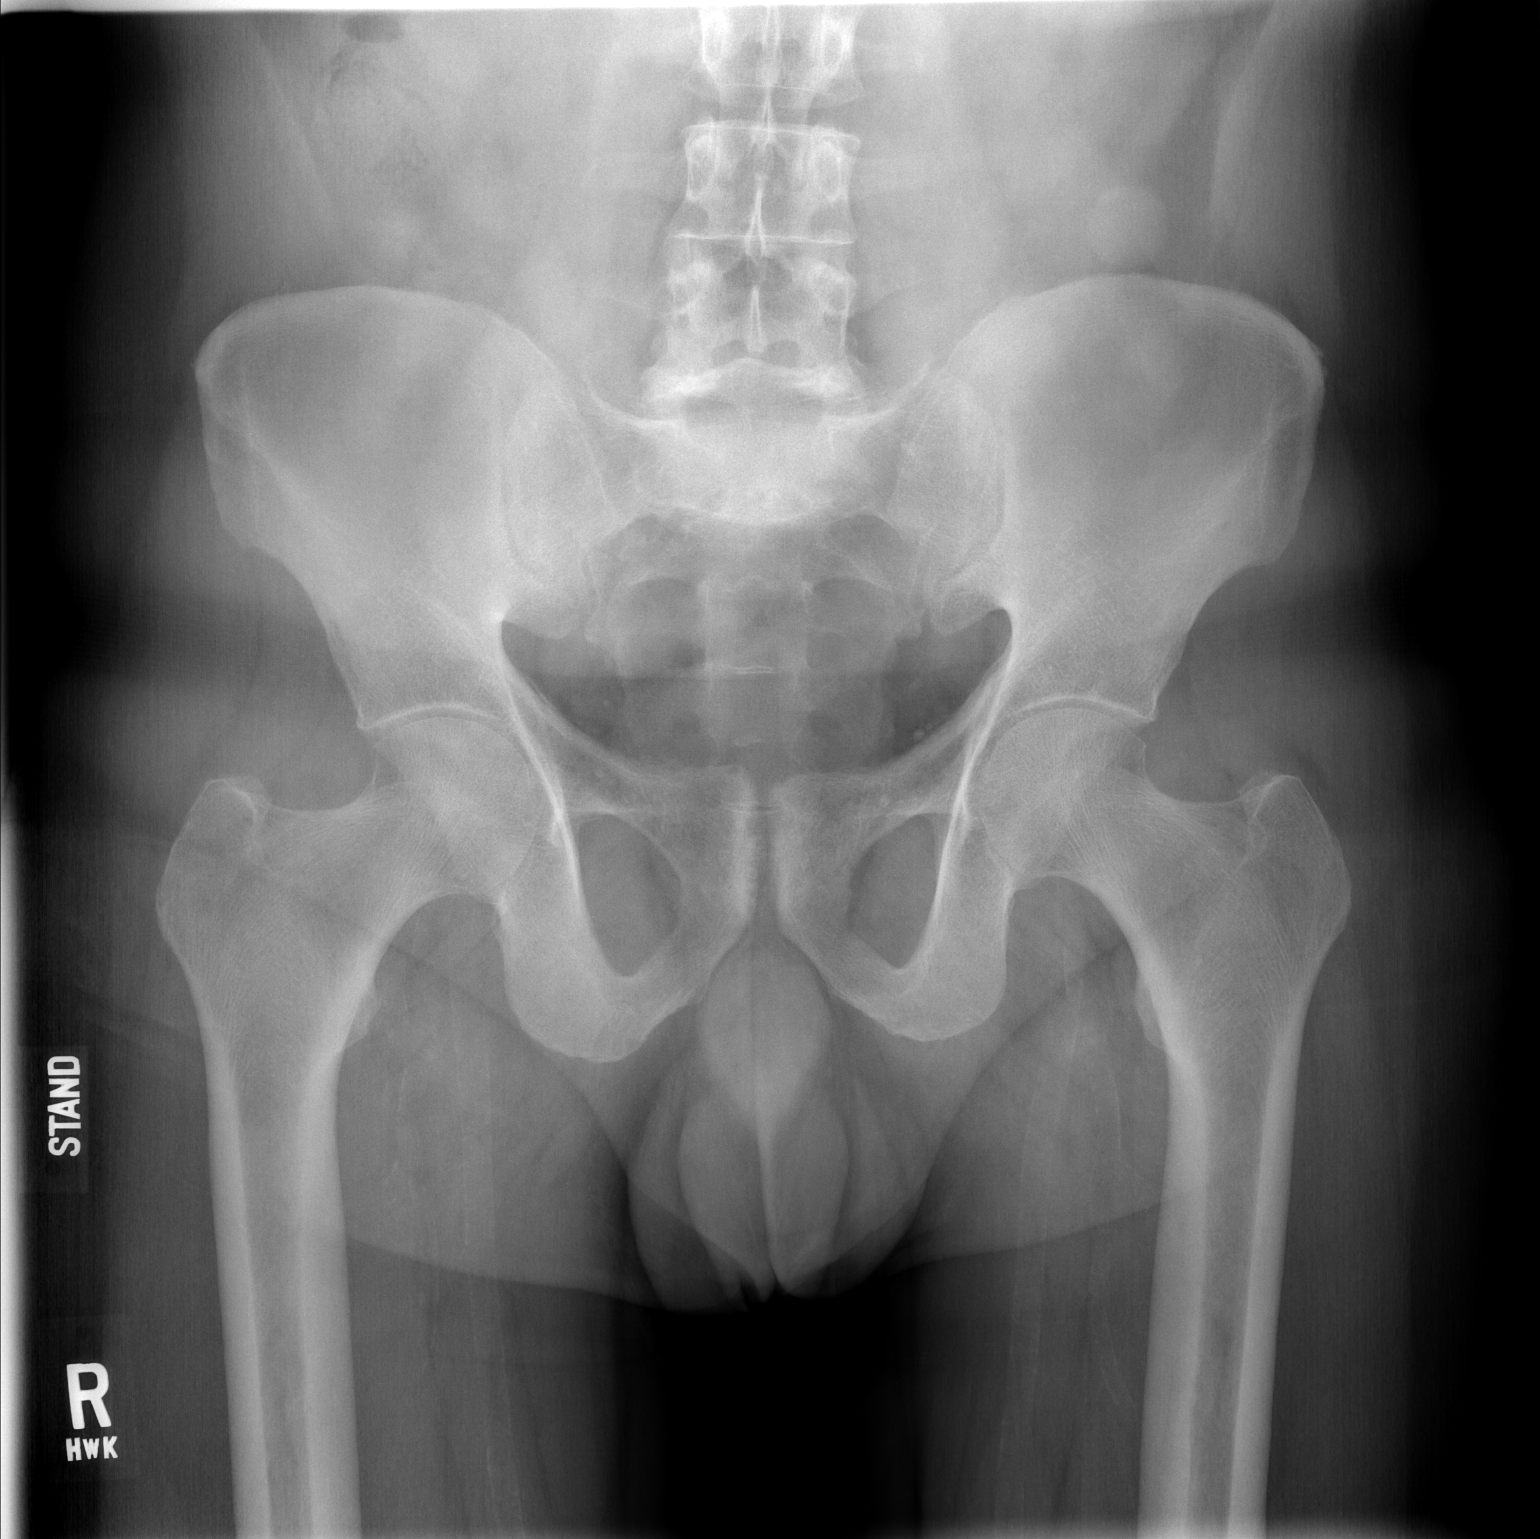

[w pelvis * (2 of 2)]
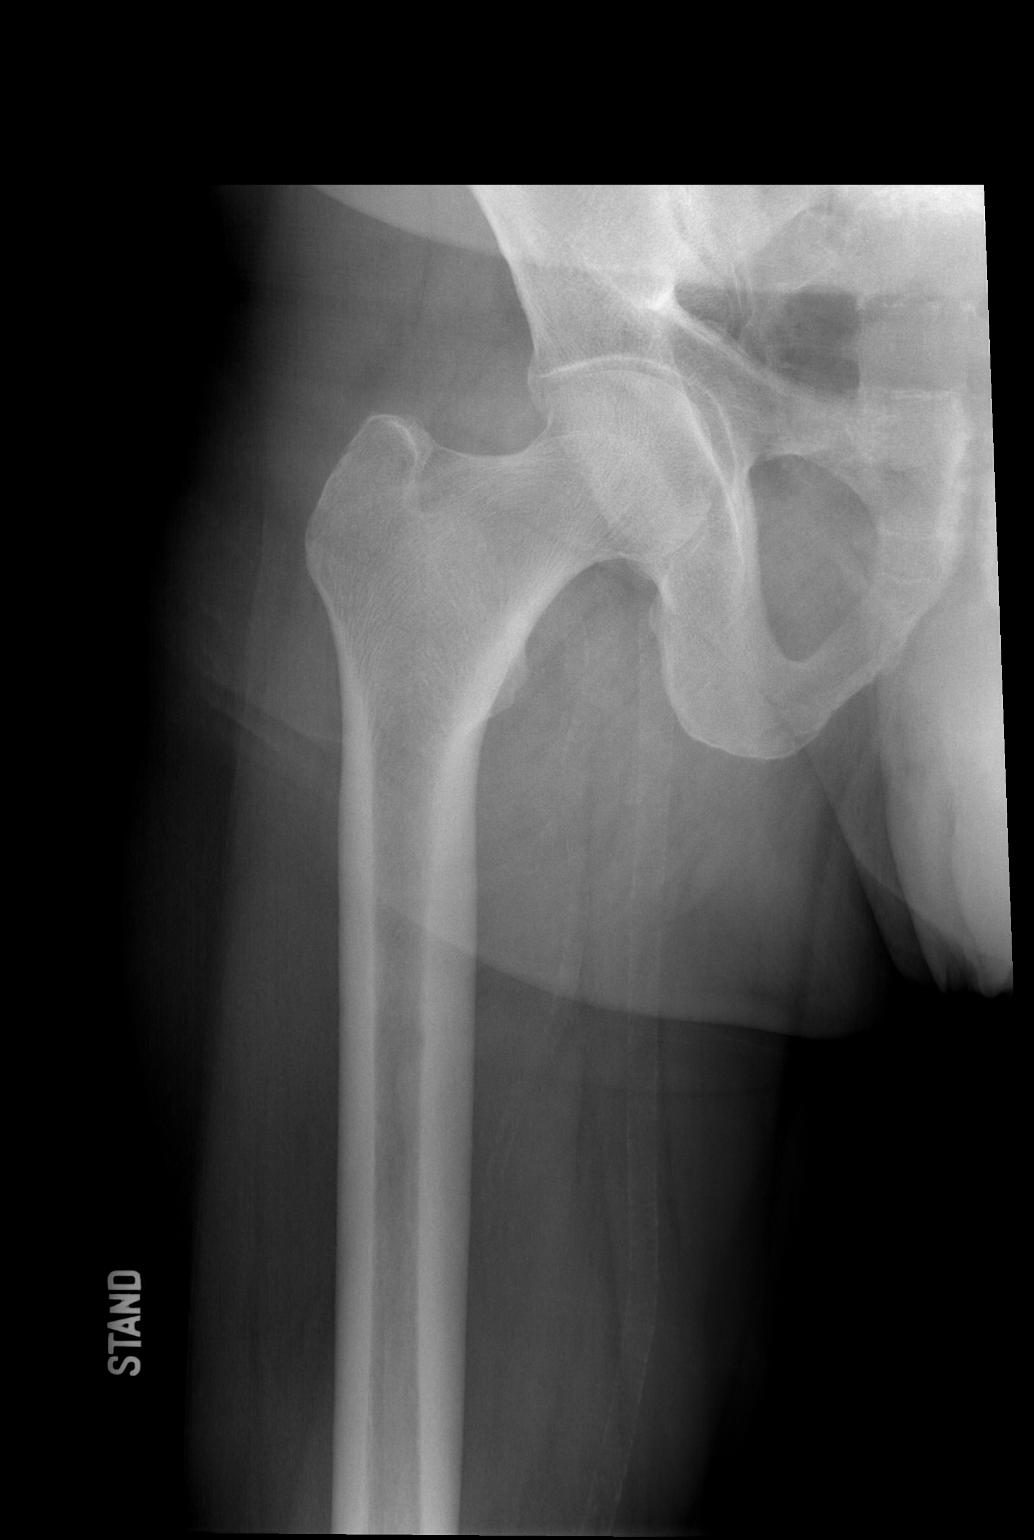

[2 of 2 positions shown; findings below may reference images not displayed]

FINDINGS: The mineralization and alignment are normal. There is no evidence of
acute fracture or dislocation. There is no evidence of femoral head
avascular necrosis. There is mild symmetric joint space narrowing in
both hips without significant osteophyte formation. The sacroiliac
joints appear normal. Moderate changes of osteitis pubis are noted.
The L5-S1 spondylolisthesis is not well visualized on these views.
IMPRESSION: Mild symmetric degenerative changes of both hips with moderate
osteitis pubis. The patient's radicular symptoms may be referred
from the L5-S1 level ; consider lumbar MRI for further evaluation.

## 2016-04-13 IMAGING — CR DG CERVICAL SPINE COMPLETE 4+V
7 series · 7 of 7 positions shown · non-contrast
Comparison: None.

CLINICAL DATA: Acute pain and stiffness in the posterior neck for 2
months. No known injury.

EXAM:
CERVICAL SPINE  4+ VIEWS

[w c-spine lat]
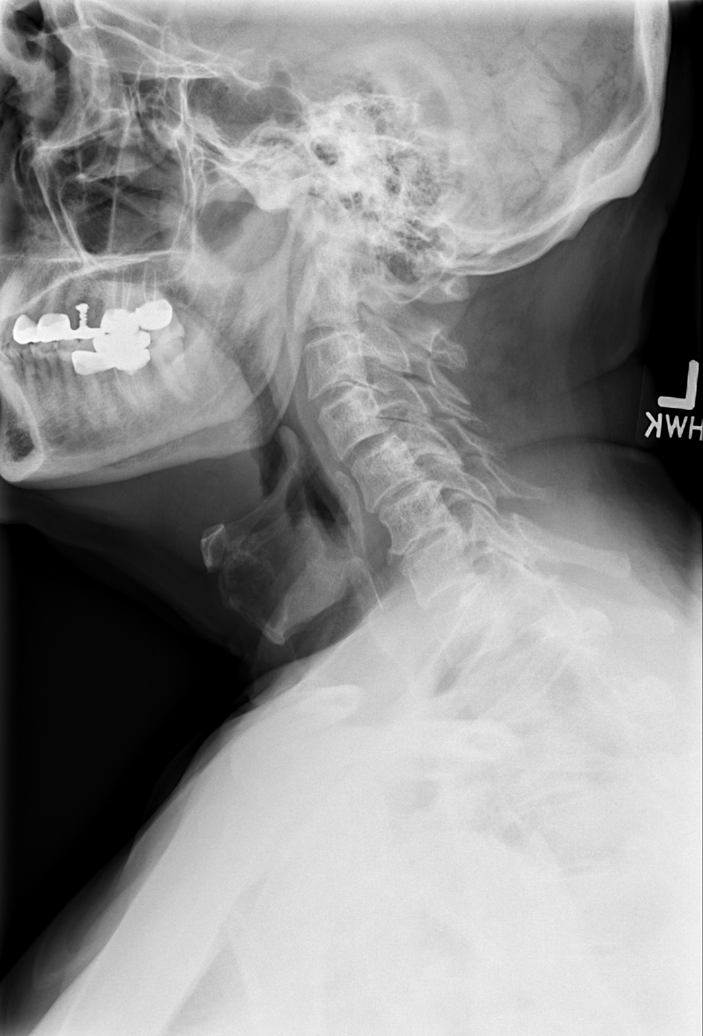

[w c-spine oblique (1 of 2)]
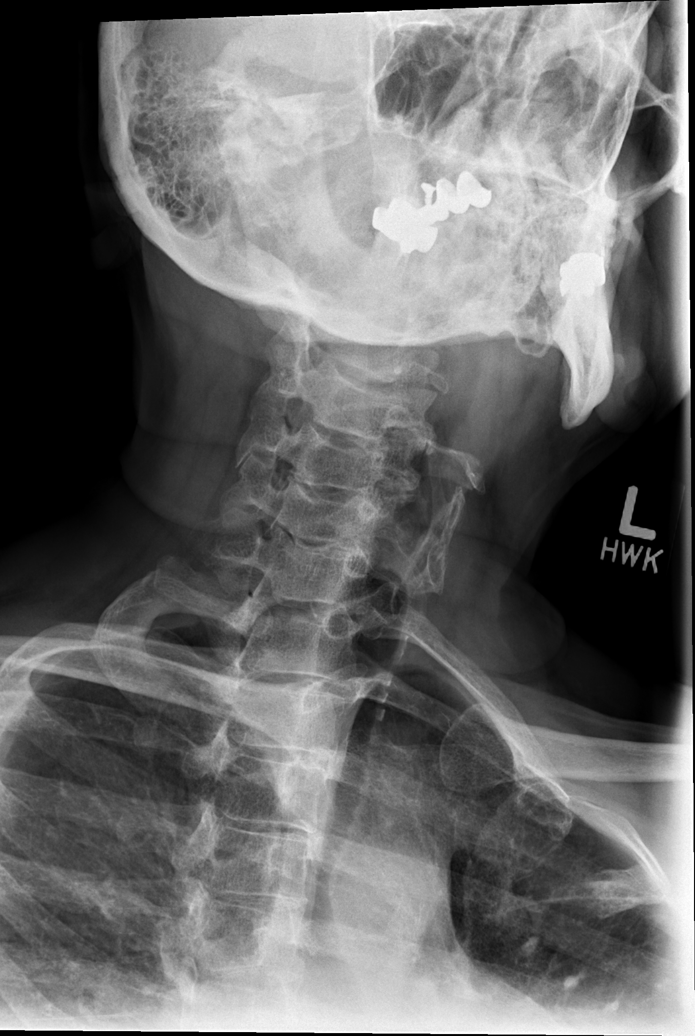

[w c-spine oblique (2 of 2)]
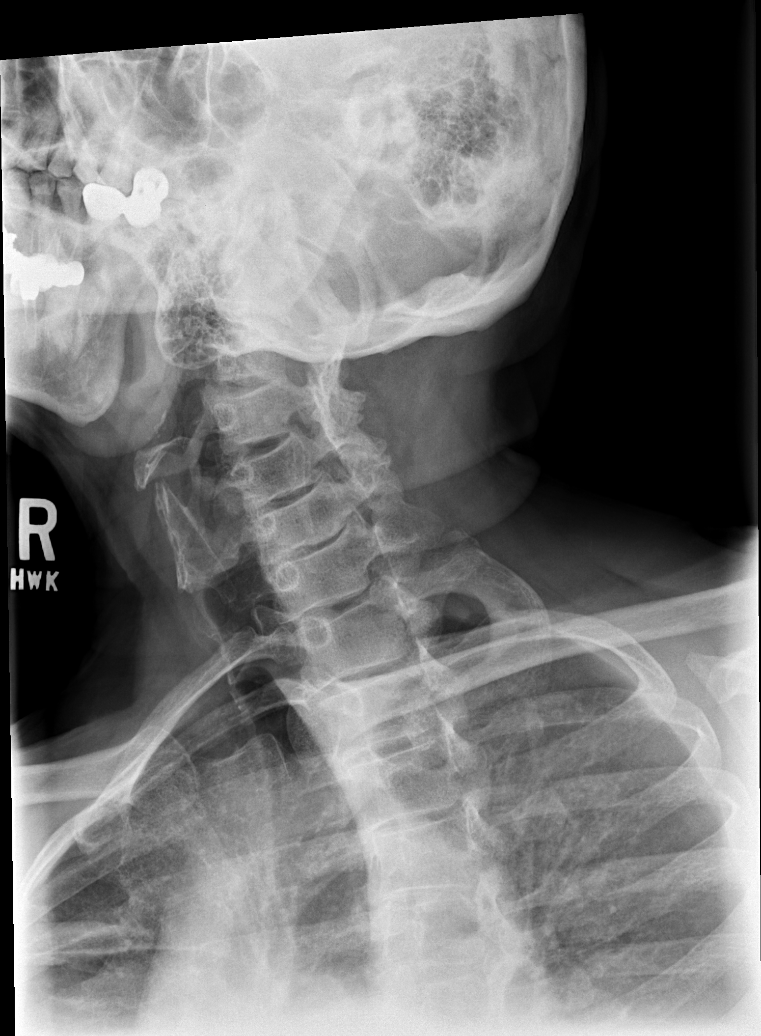

[w c-spine a.p. *]
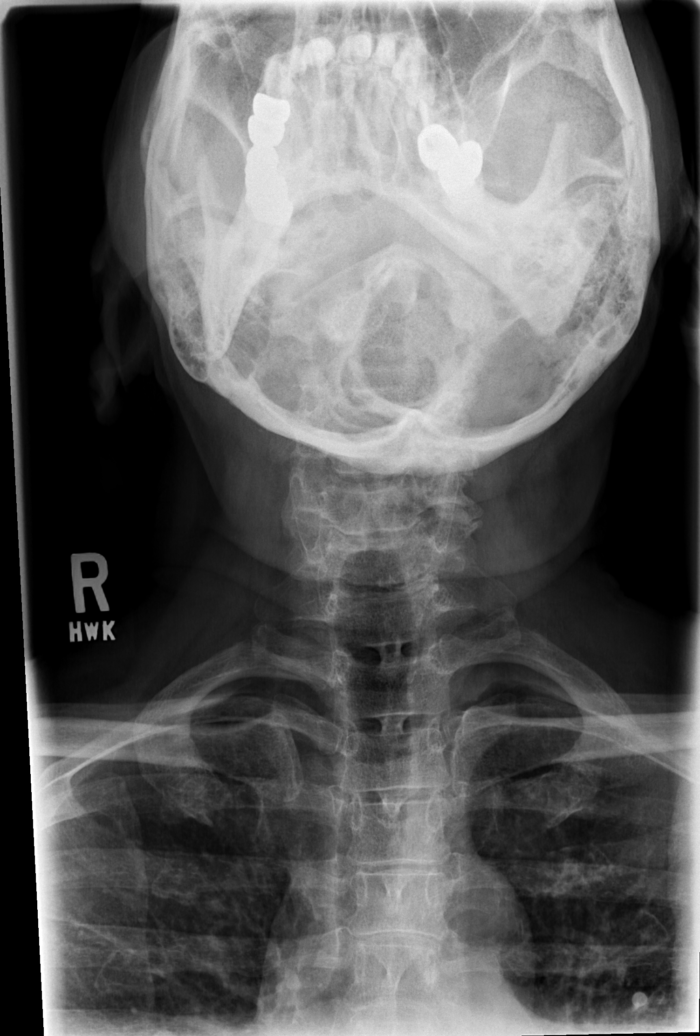

[w c-spine odontoid * (1 of 2)]
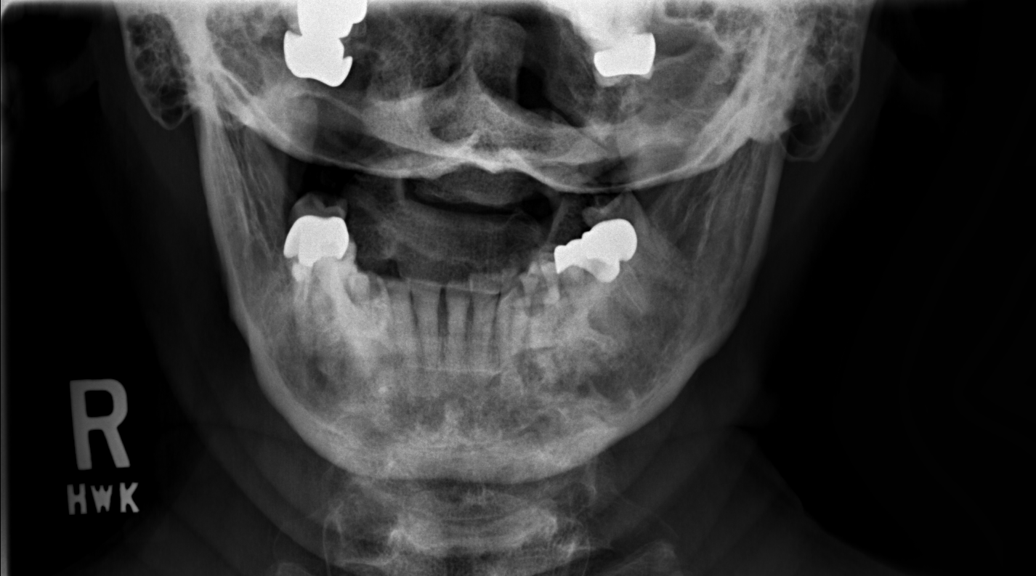

[w c-spine odontoid * (2 of 2)]
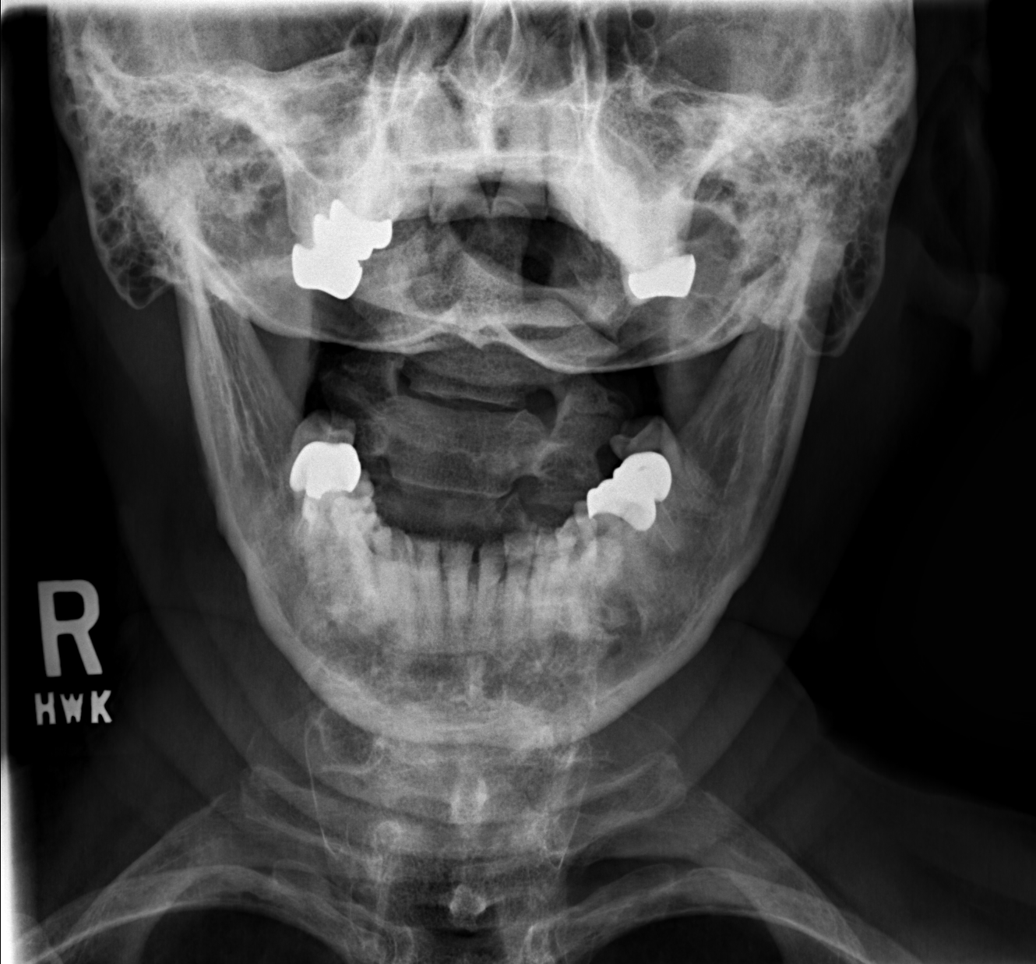

[w swimmers view *]
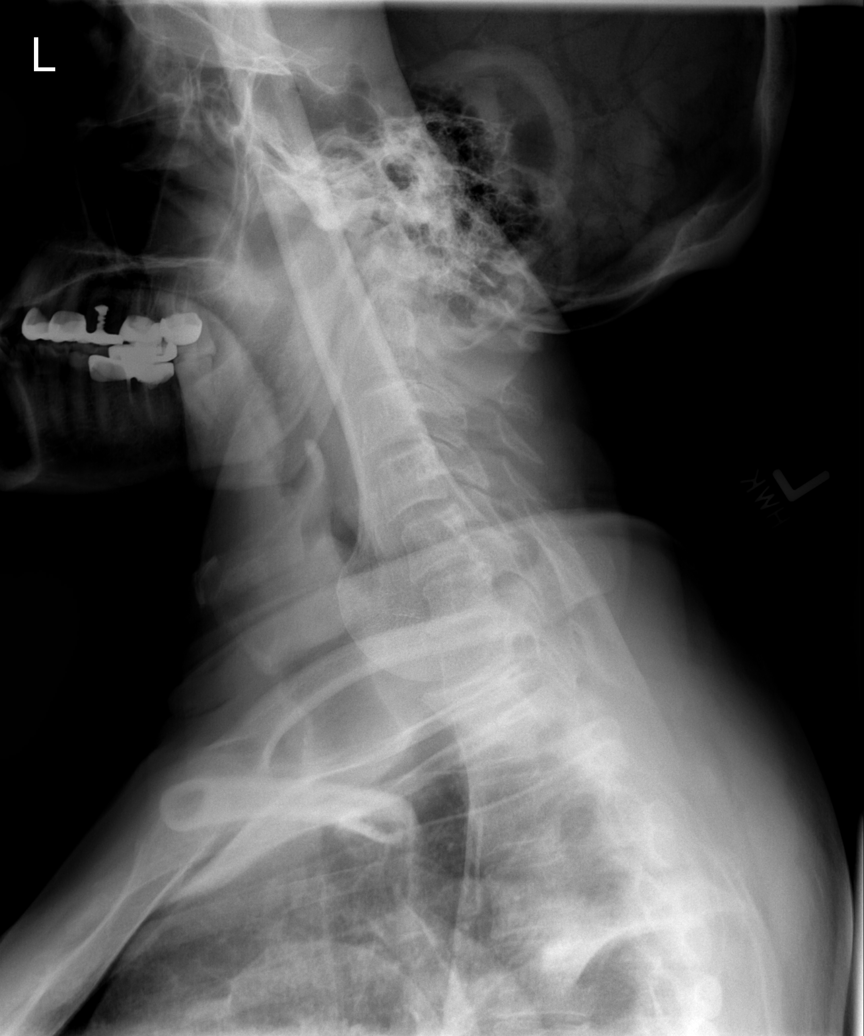

[7 of 7 positions shown; findings below may reference images not displayed]

FINDINGS: The prevertebral soft tissues are normal. There is a mild convex
right scoliosis with mild disc space loss, uncinate spurring and
facet disease, greatest at C5-6 and C6-7. The arch of C1 is not well
visualized and may be partially incorporated into the occiput. The
odontoid process appears intact. Oblique views demonstrate mild
left-sided foraminal narrowing at C6-7. No acute osseous findings
identified.
IMPRESSION: Mild cervical spondylosis as described without evidence of acute
cervical spine injury. Question partial incorporation of the arch of
C1 into the occiput.

## 2016-04-13 IMAGING — CR DG LUMBAR SPINE COMPLETE 4+V
5 series · 5 of 5 positions shown · non-contrast
Comparison: None.

CLINICAL DATA: Chronic pain.

EXAM:
LUMBAR SPINE - COMPLETE 4+ VIEW

[t l-spine a.p.]
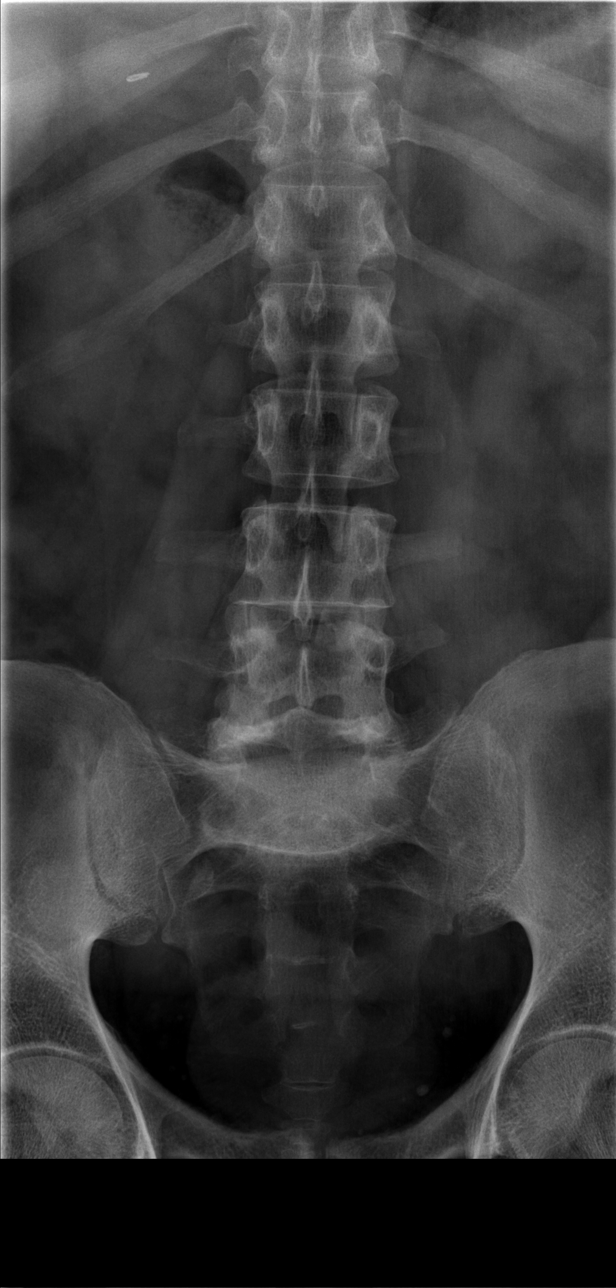

[t l-spine oblique exposure (1 of 2)]
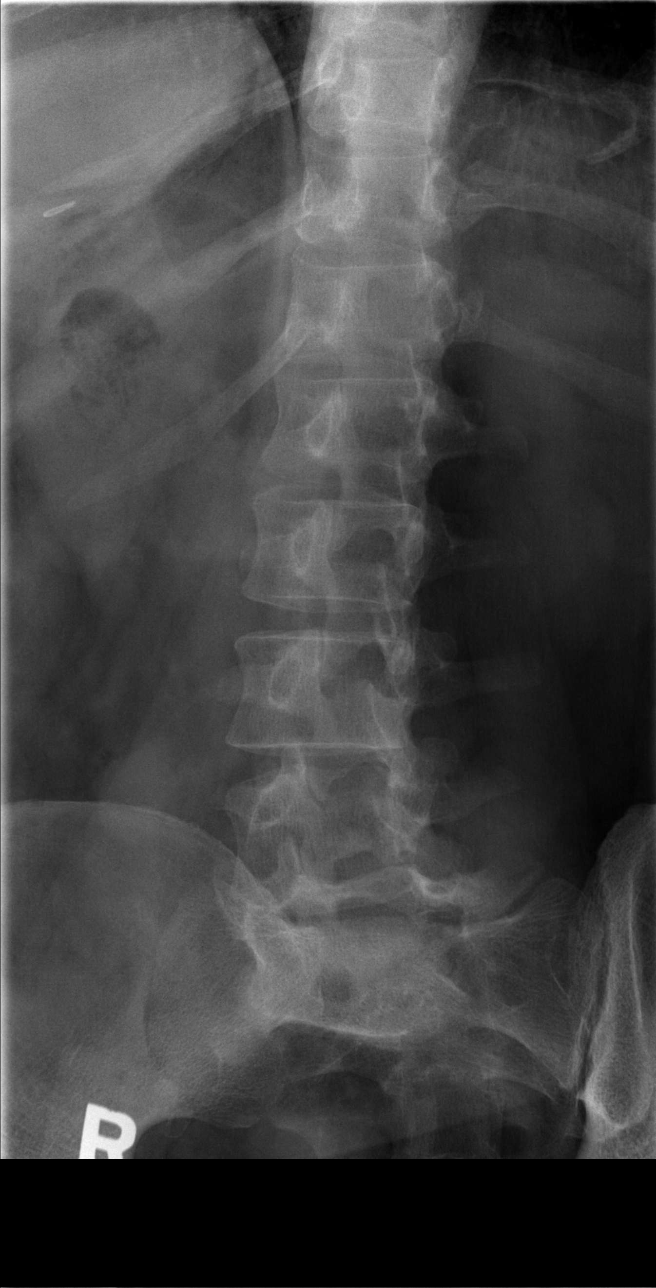

[t l-spine oblique exposure (2 of 2)]
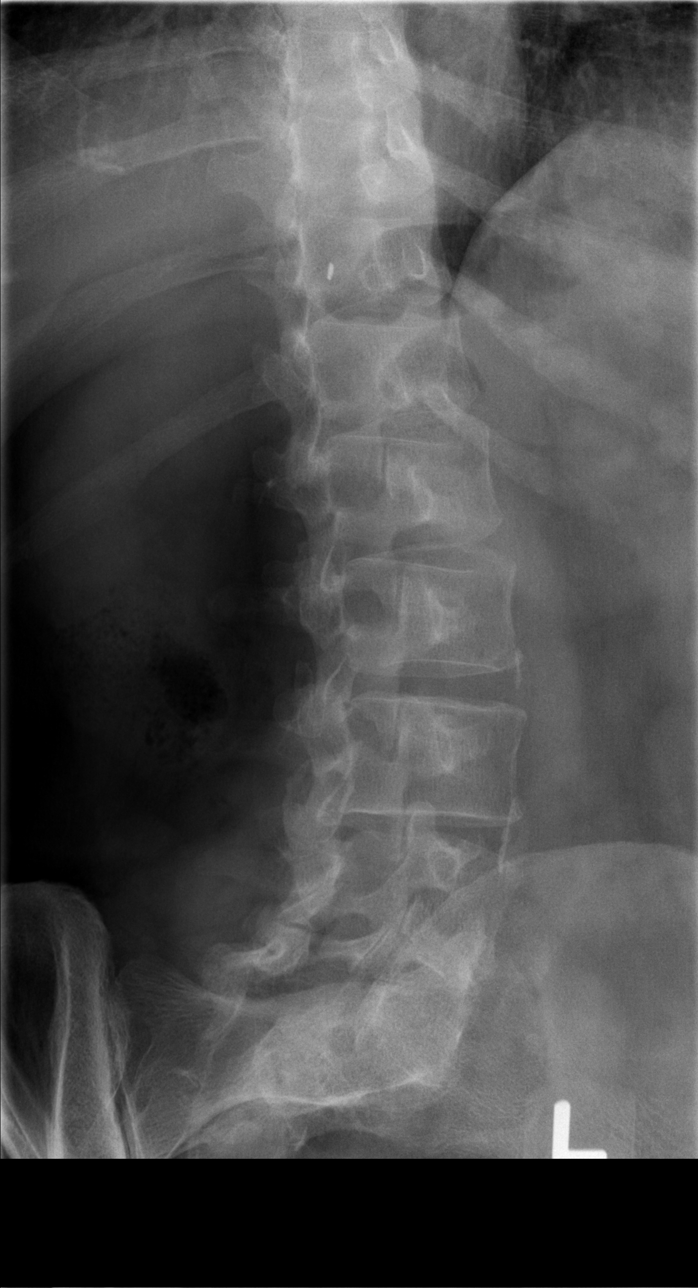

[t l-spine lat]
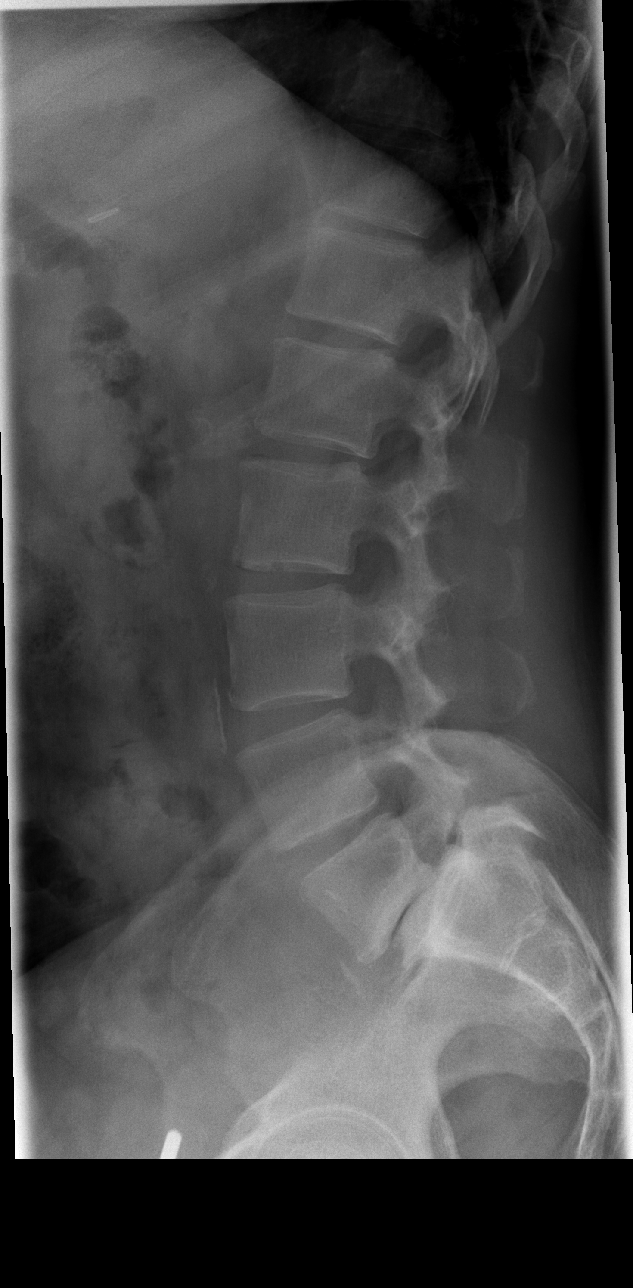

[t l-spine l5-s1 spot]
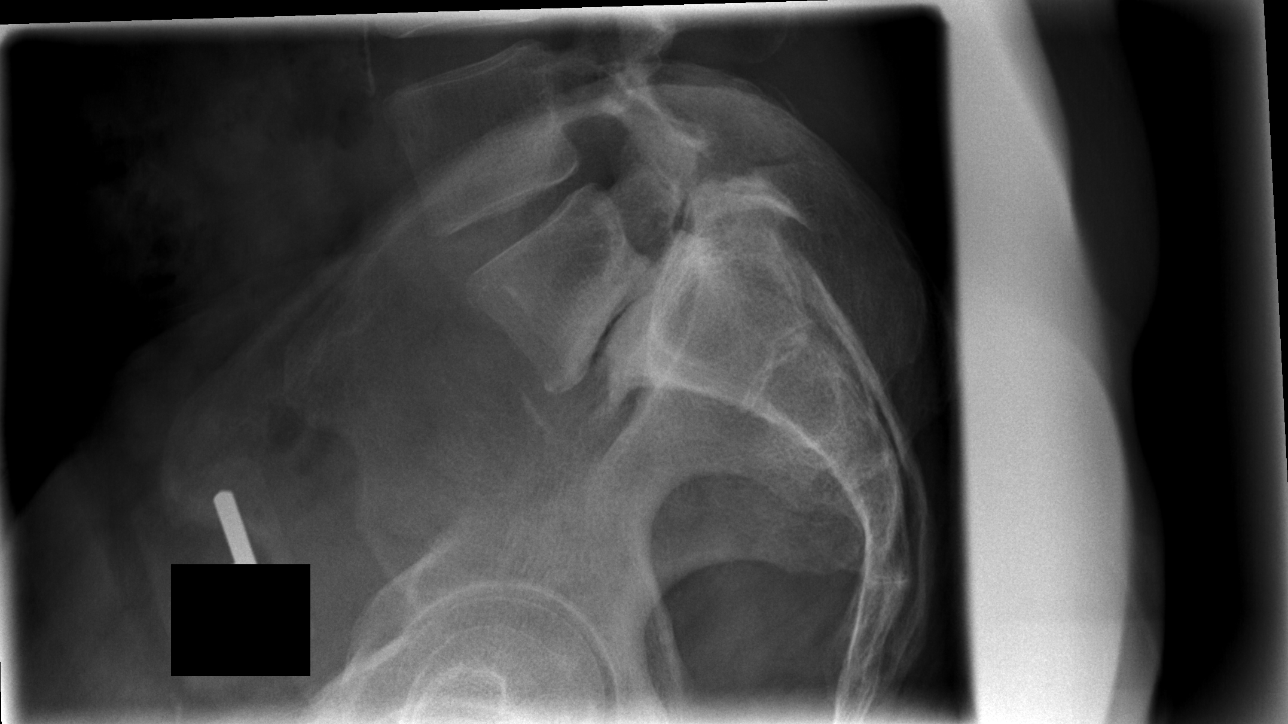

[5 of 5 positions shown; findings below may reference images not displayed]

FINDINGS: Paraspinal soft tissues are unremarkable. Diffuse multilevel
degenerative change noted throughout the lumbar spine. Disc space
loss is particular prominent at L5-S1. There is 8 mm anterolisthesis
of L5 on S1. L5 pars defects again noted.
IMPRESSION: Stable multilevel degenerative changes lumbar spine, particularly
prominent L5-S1. 8 mm anterolisthesis of L5 on S1 again noted.

## 2016-04-18 ENCOUNTER — Encounter: Payer: Self-pay | Admitting: Podiatry

## 2016-04-18 ENCOUNTER — Ambulatory Visit (INDEPENDENT_AMBULATORY_CARE_PROVIDER_SITE_OTHER): Payer: Medicare Other | Admitting: Podiatry

## 2016-04-18 ENCOUNTER — Telehealth: Payer: Self-pay | Admitting: *Deleted

## 2016-04-18 ENCOUNTER — Telehealth: Payer: Self-pay | Admitting: Family Medicine

## 2016-04-18 VITALS — BP 138/88 | HR 91 | Resp 18

## 2016-04-18 DIAGNOSIS — R0989 Other specified symptoms and signs involving the circulatory and respiratory systems: Secondary | ICD-10-CM

## 2016-04-18 DIAGNOSIS — E1142 Type 2 diabetes mellitus with diabetic polyneuropathy: Secondary | ICD-10-CM | POA: Diagnosis not present

## 2016-04-18 NOTE — Telephone Encounter (Signed)
Dr. Leeanne Deeduchman ordered B/L arterial dopplers for diminished pulses. [facility]

## 2016-04-18 NOTE — Patient Instructions (Signed)
Today your diabetic foot screen demonstrated decreased feeling in your feet Difficulty feeling the pulses in your feet and will refer you for a circulation tests, lower extremity arterial Doppler to assess neurovascular status in your legs and feet. Will notify you of the results Will obtain certification for diabetic shoes  Diabetes and Foot Care Diabetes may cause you to have problems because of poor blood supply (circulation) to your feet and legs. This may cause the skin on your feet to become thinner, break easier, and heal more slowly. Your skin may become dry, and the skin may peel and crack. You may also have nerve damage in your legs and feet causing decreased feeling in them. You may not notice minor injuries to your feet that could lead to infections or more serious problems. Taking care of your feet is one of the most important things you can do for yourself. Follow these instructions at home:  Wear shoes at all times, even in the house. Do not go barefoot. Bare feet are easily injured.  Check your feet daily for blisters, cuts, and redness. If you cannot see the bottom of your feet, use a mirror or ask someone for help.  Wash your feet with warm water (do not use hot water) and mild soap. Then pat your feet and the areas between your toes until they are completely dry. Do not soak your feet as this can dry your skin.  Apply a moisturizing lotion or petroleum jelly (that does not contain alcohol and is unscented) to the skin on your feet and to dry, brittle toenails. Do not apply lotion between your toes.  Trim your toenails straight across. Do not dig under them or around the cuticle. File the edges of your nails with an emery board or nail file.  Do not cut corns or calluses or try to remove them with medicine.  Wear clean socks or stockings every day. Make sure they are not too tight. Do not wear knee-high stockings since they may decrease blood flow to your legs.  Wear shoes  that fit properly and have enough cushioning. To break in new shoes, wear them for just a few hours a day. This prevents you from injuring your feet. Always look in your shoes before you put them on to be sure there are no objects inside.  Do not cross your legs. This may decrease the blood flow to your feet.  If you find a minor scrape, cut, or break in the skin on your feet, keep it and the skin around it clean and dry. These areas may be cleansed with mild soap and water. Do not cleanse the area with peroxide, alcohol, or iodine.  When you remove an adhesive bandage, be sure not to damage the skin around it.  If you have a wound, look at it several times a day to make sure it is healing.  Do not use heating pads or hot water bottles. They may burn your skin. If you have lost feeling in your feet or legs, you may not know it is happening until it is too late.  Make sure your health care provider performs a complete foot exam at least annually or more often if you have foot problems. Report any cuts, sores, or bruises to your health care provider immediately. Contact a health care provider if:  You have an injury that is not healing.  You have cuts or breaks in the skin.  You have an ingrown nail.  You notice redness on your legs or feet.  You feel burning or tingling in your legs or feet.  You have pain or cramps in your legs and feet.  Your legs or feet are numb.  Your feet always feel cold. Get help right away if:  There is increasing redness, swelling, or pain in or around a wound.  There is a red line that goes up your leg.  Pus is coming from a wound.  You develop a fever or as directed by your health care provider.  You notice a bad smell coming from an ulcer or wound. This information is not intended to replace advice given to you by your health care provider. Make sure you discuss any questions you have with your health care provider. Document Released: 03/09/2000  Document Revised: 08/18/2015 Document Reviewed: 08/19/2012 Elsevier Interactive Patient Education  2017 [employer]

## 2016-04-18 NOTE — Progress Notes (Signed)
   Subjective:    Patient ID: Jose Brady, male    DOB: [birth year], 66 y.o.   MRN: [id number]  HPI   I am a diabetic and just need to have my feet checked and I would like to get diabetic shoes  This diabetic patient presents today requesting diabetic foot examination and replacement of diabetic shoes. Patient states these a diabetic for approximately 10 years and he denies any history of skin ulceration amputation or claudication. He says that he wears diabetic shoes because of left calf pain which she says is less painful when wearing existing diabetic shoes  Patient denies history of smoking Patient has part-time retail job  Review of Systems  Eyes:       Glaucoma  Cardiovascular:       Blood pressure   Endocrine:       Diabetic and thyroid  All other systems reviewed and are negative.      Objective:   Physical Exam  Orientated 3  Vascular: No peripheral edema bilaterally DP pulses 2/4 bilaterally PT pulses nonpalpable bilaterally Capillary reflex delayed bilaterally  Neurological: Sensation to 10 g monofilament wire intact 3/5 right and 2/5 left Vibratory sensation reactive bilaterally Ankle reflex reactive bilaterally  Dermatological: No open skin lesions bilaterally No skin lesions bilaterally The toenails appear normal trophic intranasally 6-10  Musculoskeletal: Overlapping second right toe Manual motor testing dorsi flexion, plantar flexion, inversion, eversion 5/5 bilaterally There is no restriction ankle, subtalar, midtarsal joints bilaterally       Assessment & Plan:   Assessment: Diabetic with absent posterior tibial pulses Diabetic with loss of intact sensation to 10 g monofilament wire Overlapping second right toe  Plan: I reviewed the results of the exam with patient today and informed him that the posterior tibial pulses were difficult to palpate. Are referring him to the vascular lab for a lower extremity bilateral  arterial Doppler for the indication of diabetes and absent PT pulses. Notify patient upon receipt of vascular Doppler  Obtain certification for diabetic shoes: Indication Type II diabetic with circulatory complications Absent pedal pulses Delayed capillary fill time  Notify patient upon certification for diabetic shoes Review results of lower extremity arterial Doppler when available  Follow-up based on pending urtication for diabetic shoes Pending results for lower extremity arterial Doppler Overlapping second right toe/hammertoe Type II diabetic loss of [place] sensation

## 2016-04-18 NOTE — Telephone Encounter (Signed)
Pt. Came into facility requesting his lab results that he had done on 03/30/16. Please f/u with pt.

## 2016-04-19 ENCOUNTER — Telehealth: Payer: Self-pay

## 2016-04-19 NOTE — Telephone Encounter (Signed)
Writer called patient with results.  Patient stated understanding and will continue taking fish oil capsules.

## 2016-04-19 NOTE — Telephone Encounter (Signed)
-----   Message from Jaclyn ShaggyEnobong Amao, MD sent at 04/03/2016 12:32 PM EST ----- Labs are stable. Triglycerides are slightly elevated and he is advised to continue taking fish oil capsules.

## 2016-04-19 NOTE — Telephone Encounter (Signed)
Writer spoke with patient and was able to discuss lab results.

## 2016-09-21 ENCOUNTER — Other Ambulatory Visit: Payer: Self-pay | Admitting: Internal Medicine

## 2016-09-21 ENCOUNTER — Other Ambulatory Visit: Payer: Self-pay | Admitting: Family Medicine

## 2016-09-21 DIAGNOSIS — I10 Essential (primary) hypertension: Secondary | ICD-10-CM

## 2016-12-09 ENCOUNTER — Other Ambulatory Visit: Payer: Self-pay | Admitting: Family Medicine

## 2016-12-09 DIAGNOSIS — K219 Gastro-esophageal reflux disease without esophagitis: Secondary | ICD-10-CM

## 2017-02-07 ENCOUNTER — Other Ambulatory Visit: Payer: Self-pay | Admitting: Family Medicine

## 2017-02-07 DIAGNOSIS — E1149 Type 2 diabetes mellitus with other diabetic neurological complication: Secondary | ICD-10-CM
